# Patient Record
Sex: Female | Born: 1975 | Race: White | Hispanic: No | Marital: Married | Smoking: Never smoker
Health system: Southern US, Community
[De-identification: ages and names within clinical notes are randomized; demographics above are authoritative.]

## PROBLEM LIST (undated history)

## (undated) ENCOUNTER — Inpatient Hospital Stay (HOSPITAL_COMMUNITY): Payer: Self-pay

## (undated) DIAGNOSIS — C439 Malignant melanoma of skin, unspecified: Secondary | ICD-10-CM

## (undated) HISTORY — DX: Malignant melanoma of skin, unspecified: C43.9

---
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
## 1999-09-14 ENCOUNTER — Inpatient Hospital Stay (HOSPITAL_COMMUNITY): Admission: AD | Admit: 1999-09-14 | Discharge: 1999-09-14 | Payer: Self-pay | Admitting: Obstetrics & Gynecology

## 2004-12-17 ENCOUNTER — Other Ambulatory Visit: Admission: RE | Admit: 2004-12-17 | Discharge: 2004-12-17 | Payer: Self-pay | Admitting: Gynecology

## 2005-12-23 ENCOUNTER — Other Ambulatory Visit: Admission: RE | Admit: 2005-12-23 | Discharge: 2005-12-23 | Payer: Self-pay | Admitting: Gynecology

## 2006-09-20 DIAGNOSIS — C439 Malignant melanoma of skin, unspecified: Secondary | ICD-10-CM

## 2006-09-20 HISTORY — DX: Malignant melanoma of skin, unspecified: C43.9

## 2006-09-20 HISTORY — PX: OTHER SURGICAL HISTORY: SHX169

## 2006-12-29 ENCOUNTER — Other Ambulatory Visit: Admission: RE | Admit: 2006-12-29 | Discharge: 2006-12-29 | Payer: Self-pay | Admitting: Gynecology

## 2007-09-21 HISTORY — PX: GALLBLADDER SURGERY: SHX652

## 2007-10-26 ENCOUNTER — Encounter (INDEPENDENT_AMBULATORY_CARE_PROVIDER_SITE_OTHER): Payer: Self-pay | Admitting: Surgery

## 2007-10-26 ENCOUNTER — Ambulatory Visit (HOSPITAL_COMMUNITY): Admission: RE | Admit: 2007-10-26 | Discharge: 2007-10-26 | Payer: Self-pay | Admitting: Surgery

## 2008-01-12 ENCOUNTER — Other Ambulatory Visit: Admission: RE | Admit: 2008-01-12 | Discharge: 2008-01-12 | Payer: Self-pay | Admitting: Gynecology

## 2008-06-06 ENCOUNTER — Encounter: Admission: RE | Admit: 2008-06-06 | Discharge: 2008-06-06 | Payer: Self-pay | Admitting: Chiropractic Medicine

## 2008-12-28 IMAGING — US US ABDOMEN COMPLETE
1 series · 14 of 25 positions shown · non-contrast
Comparison: NONE

CLINICAL DATA: Abdominal pain. 

ABDOMINAL ULTRASOUND

[Series 1: us abd · 0.33mm/px · 14 of 68 slices shown]
[im 1/68]
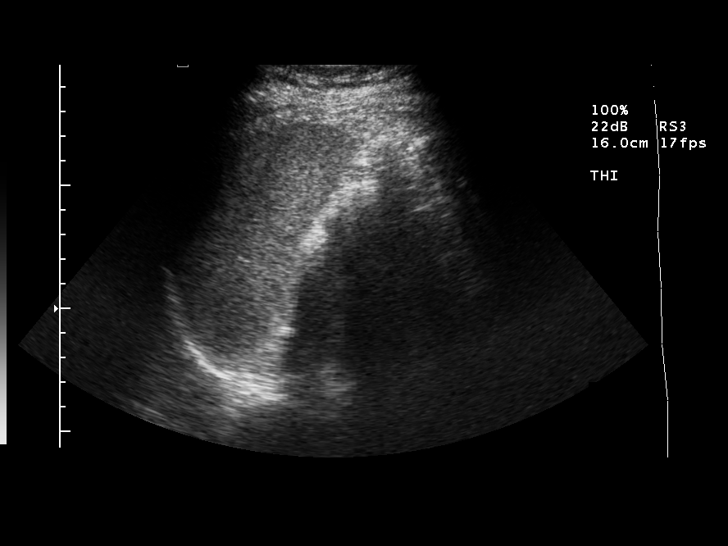
[im 6/68]
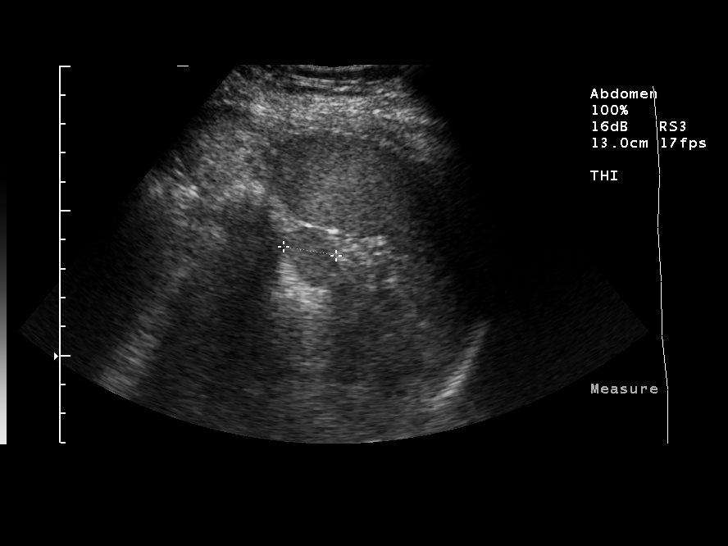
[im 12/68]
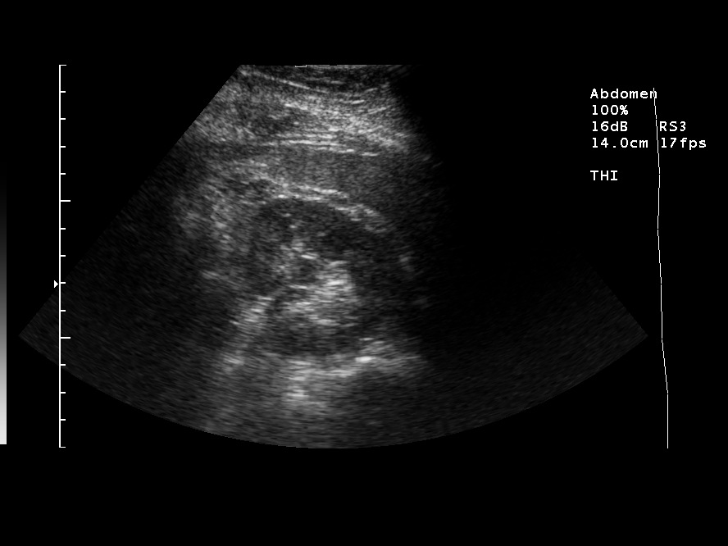
[im 17/68]
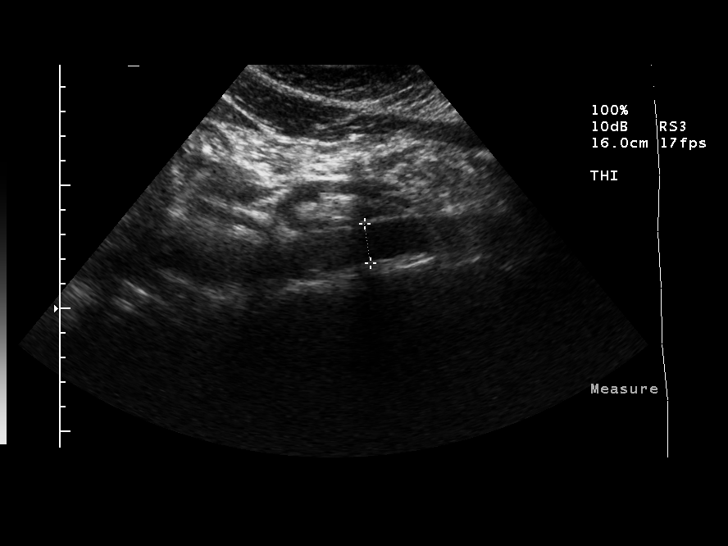
[im 23/68]
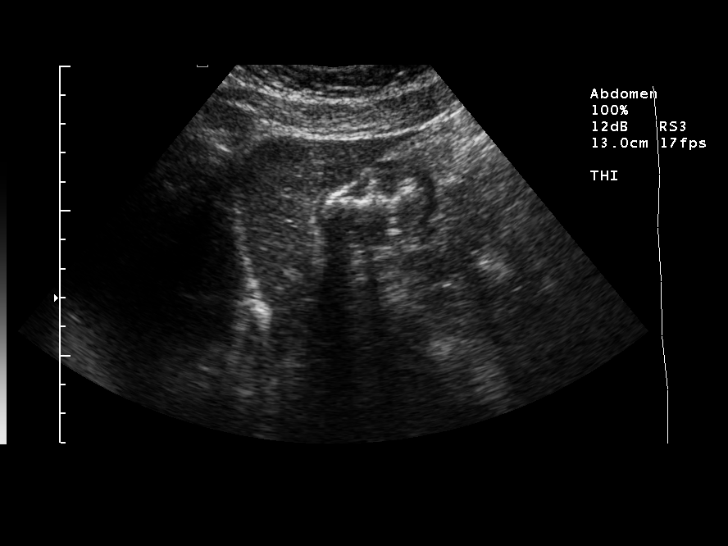
[im 26/68]
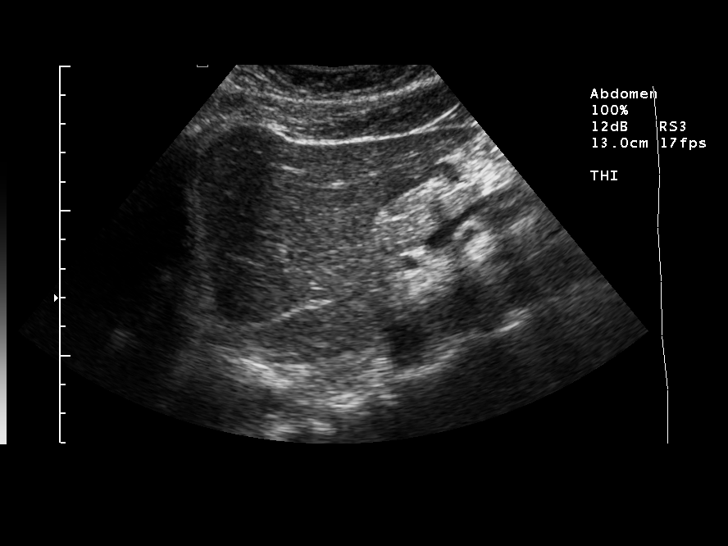
[im 31/68]
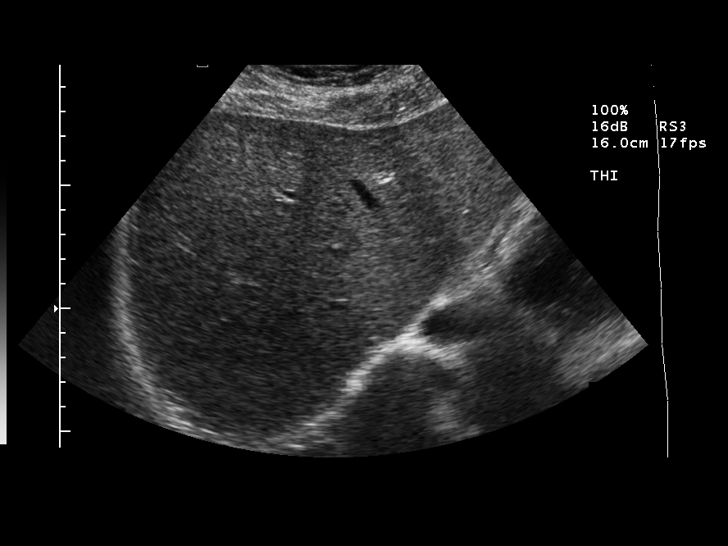
[im 37/68]
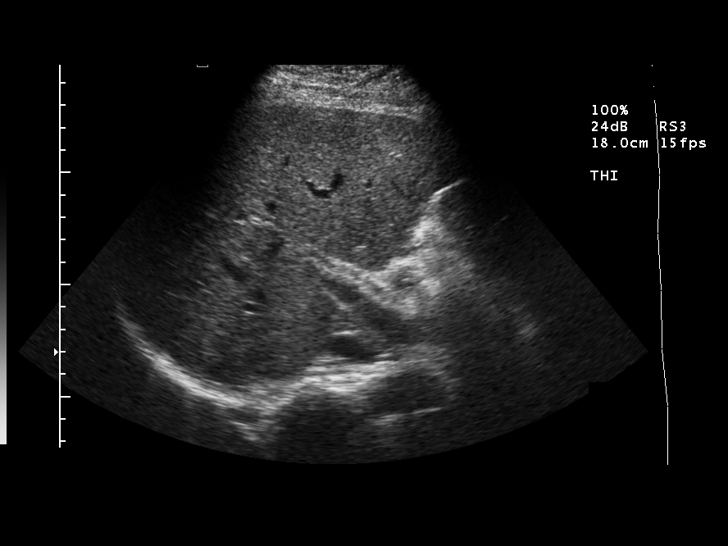
[im 42/68]
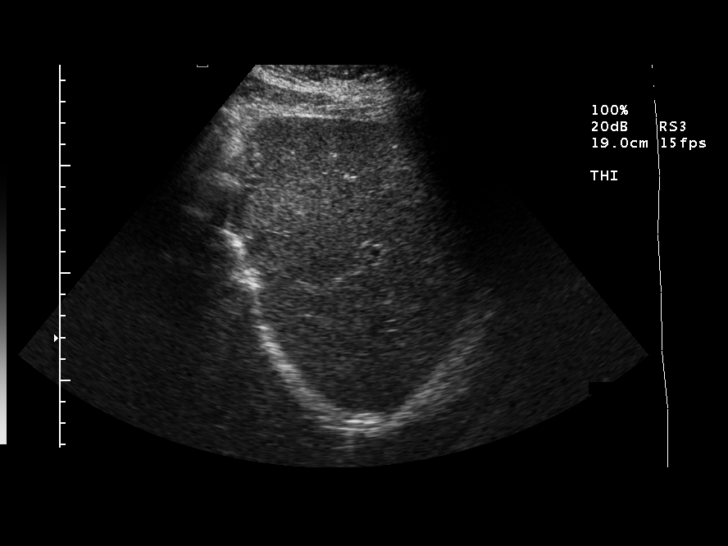
[im 45/68]
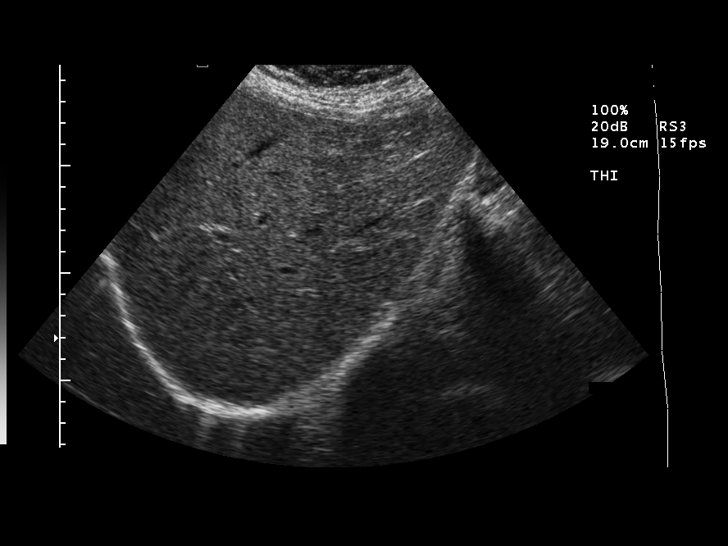
[im 51/68]
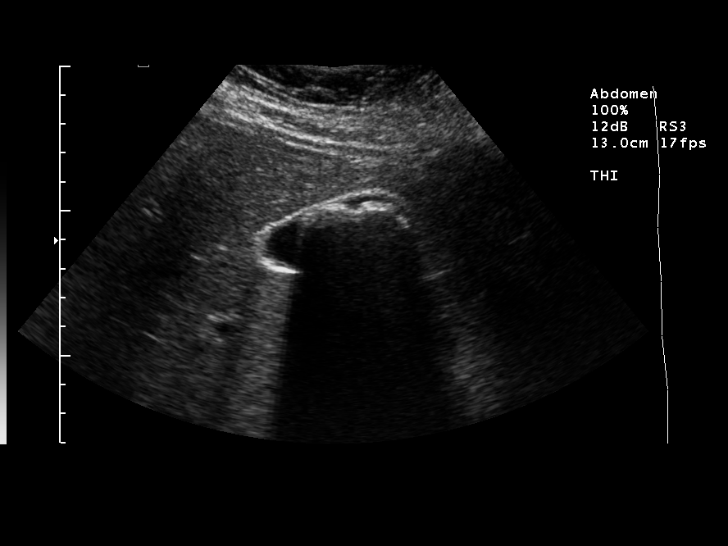
[im 56/68]
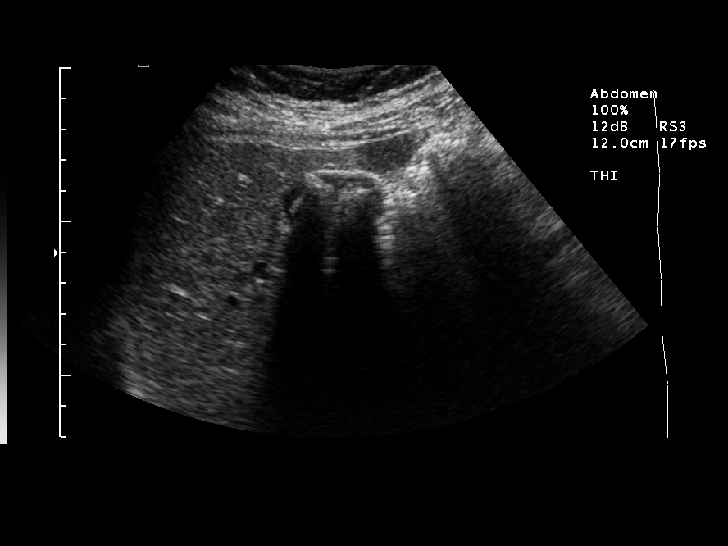
[im 62/68]
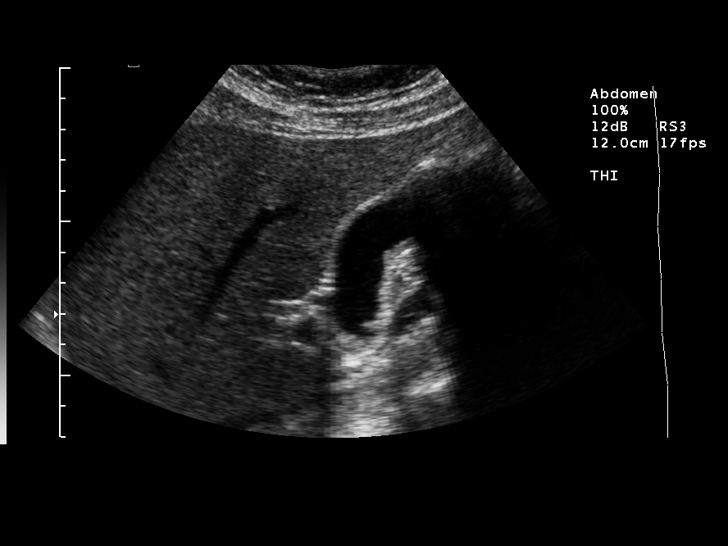
[im 68/68]
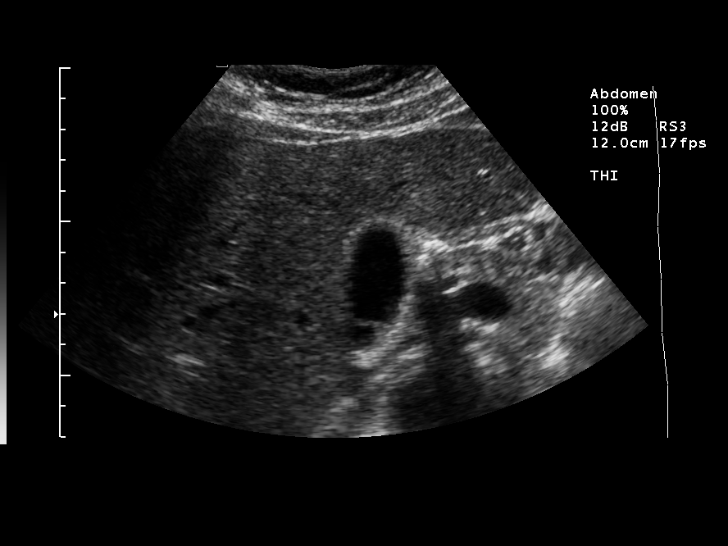

[14 of 25 positions shown; findings below may reference images not displayed]

FINDINGS: The liver is normal appearing. Evaluation of the 
gallbladder reveals multiple intraluminal echogenic foci with 
acoustical shadowing. The common bile duct is maximal at 3.5 mm. 
Aorta, pancreas, and kidneys are unremarkable. There is an 
accessory spleen noted measuring 2.1 x 1.6 x 1.8 cm. No evidence 
of splenomegaly. 

electronically reviewed on 08/04/2007 Dict Date: 08/04/2007  Tran 
Date:  08/04/2007 DAS  [REDACTED]

## 2009-01-13 ENCOUNTER — Other Ambulatory Visit: Admission: RE | Admit: 2009-01-13 | Discharge: 2009-01-13 | Payer: Self-pay | Admitting: Gynecology

## 2009-01-13 ENCOUNTER — Ambulatory Visit: Payer: Self-pay | Admitting: Women's Health

## 2009-01-13 ENCOUNTER — Encounter: Payer: Self-pay | Admitting: Women's Health

## 2009-01-25 ENCOUNTER — Emergency Department (HOSPITAL_COMMUNITY): Admission: EM | Admit: 2009-01-25 | Discharge: 2009-01-25 | Payer: Self-pay | Admitting: Emergency Medicine

## 2010-01-14 ENCOUNTER — Other Ambulatory Visit: Admission: RE | Admit: 2010-01-14 | Discharge: 2010-01-14 | Payer: Self-pay | Admitting: Gynecology

## 2010-01-14 ENCOUNTER — Ambulatory Visit: Payer: Self-pay | Admitting: Women's Health

## 2010-04-01 ENCOUNTER — Ambulatory Visit: Payer: Self-pay | Admitting: Women's Health

## 2011-02-01 ENCOUNTER — Other Ambulatory Visit (HOSPITAL_COMMUNITY)
Admission: RE | Admit: 2011-02-01 | Discharge: 2011-02-01 | Disposition: A | Discharge status: Home / Self Care | Payer: BC Managed Care – PPO | Source: Ambulatory Visit | Attending: Gynecology | Admitting: Gynecology

## 2011-02-01 ENCOUNTER — Other Ambulatory Visit: Payer: Self-pay | Admitting: Women's Health

## 2011-02-01 ENCOUNTER — Encounter (INDEPENDENT_AMBULATORY_CARE_PROVIDER_SITE_OTHER): Payer: BC Managed Care – PPO | Admitting: Women's Health

## 2011-02-01 DIAGNOSIS — R350 Frequency of micturition: Secondary | ICD-10-CM

## 2011-02-01 DIAGNOSIS — Z1159 Encounter for screening for other viral diseases: Secondary | ICD-10-CM | POA: Insufficient documentation

## 2011-02-01 DIAGNOSIS — Z124 Encounter for screening for malignant neoplasm of cervix: Secondary | ICD-10-CM | POA: Insufficient documentation

## 2011-02-01 DIAGNOSIS — Z01419 Encounter for gynecological examination (general) (routine) without abnormal findings: Secondary | ICD-10-CM

## 2011-02-01 DIAGNOSIS — R82998 Other abnormal findings in urine: Secondary | ICD-10-CM

## 2011-02-02 ENCOUNTER — Other Ambulatory Visit (INDEPENDENT_AMBULATORY_CARE_PROVIDER_SITE_OTHER): Payer: BC Managed Care – PPO

## 2011-02-02 DIAGNOSIS — R823 Hemoglobinuria: Secondary | ICD-10-CM

## 2011-02-02 NOTE — Op Note (Signed)
[name], Heather Hicks                 ACCOUNT NO.:  [id number]   MEDICAL RECORD NO.:  [id number]          PATIENT TYPE:  AMB   LOCATION:  SDS                          FACILITY:  [facility]   PHYSICIAN:  Thomas A. Cornett, M.D.DATE OF BIRTH:  1975/10/31   DATE OF PROCEDURE:  10/26/2007  DATE OF DISCHARGE:                               OPERATIVE REPORT   PREOPERATIVE DIAGNOSIS:  Symptomatic cholelithiasis.   POSTOPERATIVE DIAGNOSIS:  Symptomatic cholelithiasis.   PROCEDURE:  Laparoscopic cholecystectomy with intraoperative  cholangiogram.   SURGEON:  Thomas A. Cornett, M.D.   ASSISTANT:  Leonie Man, M.D.   ANESTHESIA:  General endotracheal anesthesia with 0.25% Sensorcaine  local.   ESTIMATED BLOOD LOSS:  20 mL.   SPECIMEN:  Gallbladder with gallstones to pathology.   DRAINS:  None.   INDICATIONS FOR PROCEDURE:  The patient is a 35 year old female with  symptomatic cholelithiasis.  She presents for elective laparoscopic  cholecystectomy for symptomatic cholelithiasis.  The risks were  discussed as well as alternative therapies in the office.  Please see  history and physical for details of that.  She agreed to proceed.   DESCRIPTION OF PROCEDURE:  The patient was brought to the operating room  and placed supine.  After induction of general anesthesia, the abdomen  was prepped and draped in a sterile fashion.  She received preoperative  antibiotics.  A 1 cm infraumbilical incision was made.  Dissection was  carried down to her fascia.  The fascia was identified, opened with a  scalpel, and grasped with Kochers.  I used a hemostat to open the  peritoneal lining into the abdominal cavity without difficulty.  A  pursestring suture of 0 Vicryl was placed and a 12 mm Hassan cannula was  placed under direct vision.  Pneumoperitoneum was created with 15 mmHg  CO2 and the laparoscope was placed.  She was placed in reversed  Trendelenburg and rolled to her left.  Inspection of the  abdominal  cavity revealed no significant abnormality.  The gallbladder was  identified and had signs of chronic cholecystitis.  An 11 mm subxiphoid  port was placed under direct vision.  Two 5 mm ports were placed in the  right upper quadrant.  The gallbladder was grabbed by its dome and  pushed toward the patient's right shoulder.  A second grasper was used  and the infundibulum was grabbed and pulled toward the patient's right  lower quadrant.  This opened the triangle of Calot.  I was able to  dissect out the cystic duct and this was the only tubular structuring  entering the gallbladder from the biliary tree.  Critical angle was  achieved.  After exposing the cystic duct, I placed a clip on the  gallbladder side, made a small incision in it and got bile back.  Through a separate stab wound, a Cook cholangiogram catheter was placed  in the cystic duct and held in place by a clip.  Intraoperative  cholangiogram was then obtained using fluoroscopy and 1/2 strength  Hypaque dye.  There was brisk filling of a short cystic duct into  a  common bile duct with free flow into the duodenum.  There was flow up  the common hepatic duct into right and left hepatic ducts.  There were  some lucencies on the initial cholangiogram run that appeared to be air  bubbles.  On the second run, these indeed behaved like air bubbles on  the film.  These did not appear to be stones since they changed shapes  and when they contacted each other became one as opposed to stones that  would behave as two separate entities on the film.  At this point, I  concluded the cholangiogram and triple clipped the cystic duct after  removing the catheter and divided it.  The cystic artery was identified.  Three clips were placed on this with one on the gallbladder side, as  well, and this was divided.  The cautery was used to dissect the  gallbladder from the gallbladder bed.  There is a posterior branch of  the cystic artery  and a clip placed on this.  Once the gallbladder was  excised from the gallbladder bed, it was placed in an EndoCatch bag.  I  irrigated out the gallbladder bed and used cautery for hemostasis.  She  had some oozing.  The cystic artery stump as well as cystic duct stump  were examined and clips were on these without leakage of bleeding or  bile.  I placed a small piece of Surgicel here as well as the liver bed  with excellent hemostasis.  The excess irrigation was suctioned out.  I  then extracted the gallbladder through the umbilical port with  visualization through the subxiphoid port.  I then closed the fascia  with #1 Vicryl after extracting the gallbladder and passing it off the  field.  The patient was flattened and the excess irrigation was  suctioned out of the right upper quadrant and her 5 mm ports were  removed as well as the 11 mm port.  At this point, after removal of  these ports, we closed the skin after allowing the CO2 to escape with 4-  0 Monocryl and Dermabond.  All final counts of sponge, needle, and  instruments were found to be correct at this portion of the case.  The  patient was awakened and taken to recovery in satisfactory condition.      Thomas A. Cornett, M.D.  Electronically Signed     TAC/MEDQ  D:  10/26/2007  T:  10/27/2007  Job:  [id number]   cc:   Sigmund Hazel, M.D.

## 2011-02-12 ENCOUNTER — Other Ambulatory Visit: Payer: BC Managed Care – PPO

## 2011-06-11 LAB — CBC
Hemoglobin: 12.1
MCHC: 33.8
Platelets: 314
RDW: 13.2

## 2011-06-11 LAB — DIFFERENTIAL
Eosinophils Absolute: 0.1
Lymphs Abs: 3.1
Monocytes Absolute: 0.5
Monocytes Relative: 6
Neutro Abs: 3.8
Neutrophils Relative %: 51

## 2011-06-11 LAB — COMPREHENSIVE METABOLIC PANEL
ALT: 13
Albumin: 3.2 — ABNORMAL LOW
Alkaline Phosphatase: 53
Calcium: 9.2
Glucose, Bld: 119 — ABNORMAL HIGH
Potassium: 4
Sodium: 139
Total Protein: 6.3

## 2011-08-16 ENCOUNTER — Other Ambulatory Visit: Payer: Self-pay | Admitting: Obstetrics and Gynecology

## 2011-08-21 ENCOUNTER — Inpatient Hospital Stay (HOSPITAL_COMMUNITY)
Admission: AD | Admit: 2011-08-21 | Discharge: 2011-08-21 | Disposition: A | Discharge status: Home / Self Care | Payer: BC Managed Care – PPO | Source: Ambulatory Visit | Attending: Obstetrics and Gynecology | Admitting: Obstetrics and Gynecology

## 2011-08-21 ENCOUNTER — Other Ambulatory Visit: Payer: Self-pay | Admitting: Obstetrics and Gynecology

## 2011-08-21 DIAGNOSIS — Z6791 Unspecified blood type, Rh negative: Secondary | ICD-10-CM

## 2011-08-21 DIAGNOSIS — O99891 Other specified diseases and conditions complicating pregnancy: Secondary | ICD-10-CM | POA: Insufficient documentation

## 2011-08-21 DIAGNOSIS — O26899 Other specified pregnancy related conditions, unspecified trimester: Secondary | ICD-10-CM

## 2011-08-21 NOTE — Plan of Care (Signed)
Patient was for outpatient labs only. Was not to be seen in MAU unless blood type was negative. Dr. Tenny Craw had talked with the patient.

## 2012-02-11 ENCOUNTER — Other Ambulatory Visit: Payer: Self-pay

## 2012-03-02 ENCOUNTER — Encounter (HOSPITAL_COMMUNITY): Payer: Self-pay | Admitting: *Deleted

## 2012-03-02 ENCOUNTER — Inpatient Hospital Stay (HOSPITAL_COMMUNITY): Payer: BC Managed Care – PPO

## 2012-03-02 ENCOUNTER — Inpatient Hospital Stay (HOSPITAL_COMMUNITY)
Admission: AD | Admit: 2012-03-02 | Discharge: 2012-03-03 | Disposition: A | Discharge status: Home / Self Care | Payer: BC Managed Care – PPO | Source: Ambulatory Visit | Attending: Obstetrics and Gynecology | Admitting: Obstetrics and Gynecology

## 2012-03-02 DIAGNOSIS — D259 Leiomyoma of uterus, unspecified: Secondary | ICD-10-CM

## 2012-03-02 DIAGNOSIS — N83209 Unspecified ovarian cyst, unspecified side: Secondary | ICD-10-CM | POA: Insufficient documentation

## 2012-03-02 DIAGNOSIS — O34599 Maternal care for other abnormalities of gravid uterus, unspecified trimester: Secondary | ICD-10-CM | POA: Insufficient documentation

## 2012-03-02 DIAGNOSIS — R1032 Left lower quadrant pain: Secondary | ICD-10-CM | POA: Insufficient documentation

## 2012-03-02 LAB — CBC
MCHC: 32.3 g/dL (ref 30.0–36.0)
Platelets: 239 10*3/uL (ref 150–400)
RDW: 12.4 % (ref 11.5–15.5)
WBC: 12.7 10*3/uL — ABNORMAL HIGH (ref 4.0–10.5)

## 2012-03-02 LAB — URINALYSIS, ROUTINE W REFLEX MICROSCOPIC
Glucose, UA: NEGATIVE mg/dL
Leukocytes, UA: NEGATIVE
pH: 6.5 (ref 5.0–8.0)

## 2012-03-02 LAB — URINE MICROSCOPIC-ADD ON

## 2012-03-02 NOTE — MAU Provider Note (Signed)
Heather J Samet36 y.o.G2P0010 @[redacted]w[redacted]d  by LMP No chief complaint on file.    First Provider Initiated Contact with Patient 03/02/12 [id number]      SUBJECTIVE  HPI: Pt presents with report of severe LLQ pain, nausea x24 hours with vomiting x1 today, some chills, and some menstrual-like cramping.  She has had normal ultrasounds at her prenatal visits per the pt.  She denies LOF, vaginal bleeding, vaginal itching/burning, urinary symptoms, h/a, or dizziness.     Past Medical History  Diagnosis Date  . Rubella 2006    rubella positive  . Migraine   . 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 Tonsillectomy 1989  . Gallbladder surgery 2009  . Melanoma 2008    removed from neck   History   Social History  . Marital Status: Married    Spouse Name: N/A    Number of Children: N/A  . Years of Education: N/A   Occupational History  . Not on file.   Social History Main Topics  . Smoking status: Never Smoker   . Smokeless tobacco: Not on file  . Alcohol Use: No  . Drug Use: No  . Sexually Active:    Other Topics Concern  . Not on file   Social History Narrative  . No narrative on file   No current facility-administered medications on file prior to encounter.   No current outpatient prescriptions on file prior to encounter.   Allergies  Allergen Reactions  . Penicillins Rash    ROS: Pertinent items in HPI  OBJECTIVE Blood pressure 107/59, pulse 98, temperature 98 F (36.7 C), temperature source Oral, resp. rate 20, height 5\' 6"  (1.676 m), weight 103.59 kg (228 lb 6 oz).  GENERAL: Well-developed, well-nourished female in no acute distress.  HEENT: Normocephalic, good dentition HEART: normal rate RESP: normal effort ABDOMEN: Soft, Pt has mild tenderness in lower abdomen, greater on left Negative guarding, negative rebound tenderness EXTREMITIES: Nontender, no edema NEURO: Alert and oriented  Neg CVA tenderness  Cervix 0/long/high, firm,  posterior  FHT 165 by doppler  LAB RESULTS Results for orders placed during the hospital encounter of 03/02/12 (from the past 24 hour(s))  URINALYSIS, ROUTINE W REFLEX MICROSCOPIC     Status: Abnormal   Collection Time   03/02/12  7:31 PM      Component Value Range   Color, Urine YELLOW  YELLOW   APPearance CLEAR  CLEAR   Specific Gravity, Urine <1.005 (*) 1.005 - 1.030   pH 6.5  5.0 - 8.0   Glucose, UA NEGATIVE  NEGATIVE mg/dL   Hgb urine dipstick SMALL (*) NEGATIVE   Bilirubin Urine NEGATIVE  NEGATIVE   Ketones, ur NEGATIVE  NEGATIVE mg/dL   Protein, ur NEGATIVE  NEGATIVE mg/dL   Urobilinogen, UA 0.2  0.0 - 1.0 mg/dL   Nitrite NEGATIVE  NEGATIVE   Leukocytes, UA NEGATIVE  NEGATIVE  URINE MICROSCOPIC-ADD ON     Status: Normal   Collection Time   03/02/12  7:31 PM      Component Value Range   Squamous Epithelial / LPF RARE  RARE   WBC, UA 0-2  <3 WBC/hpf   RBC / HPF 0-2  <3 RBC/hpf   Bacteria, UA RARE  RARE  CBC     Status: Abnormal   Collection Time   03/02/12 10:45 PM      Component Value Range   WBC 12.7 (*) 4.0 - 10.5 K/uL  RBC 4.03  3.87 - 5.11 MIL/uL   Hemoglobin 10.9 (*) 12.0 - 15.0 g/dL   HCT 16.1 (*) 09.6 - 04.5 %   MCV 83.6  78.0 - 100.0 fL   MCH 27.0  26.0 - 34.0 pg   MCHC 32.3  30.0 - 36.0 g/dL   RDW 40.9  81.1 - 91.4 %   Platelets 239  150 - 400 K/uL     IMAGING US Renal  03/03/2012  *RADIOLOGY REPORT*  Clinical Data: Left lower quadrant pain, pregnant.  RENAL/URINARY TRACT ULTRASOUND COMPLETE  Comparison:  08/04/2007  Findings:  Right Kidney:  Measures 11.7 cm.  No hydronephrosis.  No focal abnormality.  Left Kidney:  Measures 11.9 cm.  No hydronephrosis.  There is a 1.1 x 1.0 x 0.8 cm hypoechoic/nearly anechoic lesion arising the lower pole, favored to represent a cyst however not definitively characterized on this examination.  Bladder:  Poorly visualized as the patient had with prior to the examination.  IMPRESSION: No hydronephrosis or shadowing calculi  identified.  Hypoechoic/ nearly anechoic focus arising the lower pole of the left kidney measuring 1.1 cm is favored to represent a cyst however incompletely characterized on this examination.  Consider a 60-month ultrasound follow-up.  Original Report Authenticated By: Waneta Martins, M.D.   Verbal report from sonographer of multiple uterine fibroids with largest on left side, where pt indicates greatest amount of pain  ASSESSMENT Fibroid uterus 1.1 cm cyst on left ovary, negative for renal calculi  PLAN Called Dr Henderson Cloud to review assessment and findings Ibuprofen 600 mg x1 dose in MAU D/C home F/U in office if pain persists or return to MAU  LEFTWICH-KIRBY, Sharmain Lastra 03/02/2012 10:52 PM

## 2012-03-02 NOTE — MAU Note (Signed)
PT SAYS  THIS WEEK SHE HAS FELT PRESSURE- BECAME WORSE  ON WED, THEN CALLED DR THIS AM. FEELS LOWER CRAMPS- COMES/ GOES-   HAD U/S IN OFFICE.  FEEELS CRAMPING  NOW.  TALKED  TO DR Hessie Diener ROSS - TOLD HER TO TAKE  MED  THAT SHE USUALLY TAKES FOR H/S- IF WORSE- COME TO [facility]- THEN IF NOT COME TO OFFICE TOMPRROW.  HAS HX- MIGRAINES.

## 2012-03-02 NOTE — MAU Note (Signed)
WANTS TO VOID FIRST.

## 2012-03-03 DIAGNOSIS — D259 Leiomyoma of uterus, unspecified: Secondary | ICD-10-CM

## 2012-03-03 MED ORDER — IBUPROFEN 600 MG PO TABS
600.0000 mg | ORAL_TABLET | ORAL | Status: AC
  Administered 2012-03-03: 600 mg via ORAL
  Filled 2012-03-03: qty 1

## 2012-03-03 NOTE — Discharge Instructions (Signed)
Fibroids You have been diagnosed as having a fibroid. Fibroids are smooth muscle lumps (tumors) which can occur any place in a woman's body. They are usually in the womb (uterus). The most common problem (symptom) of fibroids is bleeding. Over time this may cause low red blood cells (anemia). Other symptoms include feelings of pressure and pain in the pelvis. The diagnosis (learning what is wrong) of fibroids is made by physical exam. Sometimes tests such as an ultrasound are used. This is helpful when fibroids are felt around the ovaries and to look for tumors. TREATMENT   Most fibroids do not need surgical or medical treatment. Sometimes a tissue sample (biopsy) of the lining of the uterus is done to rule out cancer. If there is no cancer and only a small amount of bleeding, the problem can be watched.   Hormonal treatment can improve the problem.   When surgery is needed, it can consist of removing the fibroid. Vaginal birth may not be possible after the removal of fibroids. This depends on where they are and the extent of surgery. When pregnancy occurs with fibroids it is usually normal.   Your caregiver can help decide which treatments are best for you.  HOME CARE INSTRUCTIONS   Do not use aspirin as this may increase bleeding problems.   If your periods (menses) are heavy, record the number of pads or tampons used per month. Bring this information to your caregiver. This can help them determine the best treatment for you.  SEEK IMMEDIATE MEDICAL CARE IF:  You have pelvic pain or cramps not controlled with medications, or experience a sudden increase in pain.   You have an increase of pelvic bleeding between and during menses.   You feel lightheaded or have fainting spells.   You develop worsening belly (abdominal) pain.  Document Released: 09/03/2000 Document Revised: 08/26/2011 Document Reviewed: 04/25/2008 [employer] Patient Information 2012 [employer].    

## 2012-04-20 ENCOUNTER — Other Ambulatory Visit: Payer: Self-pay

## 2012-04-24 ENCOUNTER — Other Ambulatory Visit (HOSPITAL_COMMUNITY): Payer: Self-pay | Admitting: Obstetrics and Gynecology

## 2012-04-24 DIAGNOSIS — O289 Unspecified abnormal findings on antenatal screening of mother: Secondary | ICD-10-CM

## 2012-04-25 ENCOUNTER — Ambulatory Visit (HOSPITAL_COMMUNITY)
Admission: RE | Admit: 2012-04-25 | Discharge: 2012-04-25 | Disposition: A | Discharge status: Home / Self Care | Payer: BC Managed Care – PPO | Source: Ambulatory Visit | Attending: Obstetrics and Gynecology | Admitting: Obstetrics and Gynecology

## 2012-04-25 ENCOUNTER — Encounter (HOSPITAL_COMMUNITY): Payer: Self-pay

## 2012-04-25 DIAGNOSIS — O289 Unspecified abnormal findings on antenatal screening of mother: Secondary | ICD-10-CM

## 2012-04-25 DIAGNOSIS — O3500X0 Maternal care for (suspected) central nervous system malformation or damage in fetus, unspecified, not applicable or unspecified: Secondary | ICD-10-CM | POA: Insufficient documentation

## 2012-04-25 DIAGNOSIS — O358XX0 Maternal care for other (suspected) fetal abnormality and damage, not applicable or unspecified: Secondary | ICD-10-CM | POA: Insufficient documentation

## 2012-04-25 DIAGNOSIS — O350XX0 Maternal care for (suspected) central nervous system malformation in fetus, not applicable or unspecified: Secondary | ICD-10-CM | POA: Insufficient documentation

## 2012-04-25 NOTE — Progress Notes (Signed)
Heather Hicks was seen for ultrasound appointment today.  Please see [facility] report for details.

## 2012-04-27 NOTE — Progress Notes (Signed)
Genetic Counseling  High-Risk Gestation Note  Appointment Date:  04/25/2012 Referred By: Almon Hercules., MD Date of Birth:  07-07-1976    Pregnancy History: G2P0010 Estimated Date of Delivery: 09/17/12 Estimated Gestational Age: [redacted]w[redacted]d Attending: Rema Fendt, MD  I met with Ms. Heather Hicks and her partner, Mr. Heather Hicks for genetic counseling because of a prenatal diagnosis of fetal Down syndrome based on FISH (fluorescent in situ hybridization) results from amniocentesis.   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 Final karyotype analysis from amniocentesis is currently pending. We did review that the [facility] test can not distinguish between trisomy 42, translocation 39, or in some cases, mosaic trisomy 73.    We discussed that there are different types of Down syndrome, and each type is determined by the arrangement of the #21 chromosomes. Approximately 95% of cases of Down syndrome are trisomy 21 and 2-4% are due to a translocation involving chromosome 21. We reviewed chromosomes, nondisjunction, and that chromosome division errors happen by chance and are not usually inherited. They understand that the actual karyotype result from amniocentesis and will help to determine accurate risks of recurrence for a future pregnancy.   This couple was counseled that Down syndrome occurs once per every 800 births and is associated with specific features. However, there is a high degree of variability seen among children who have this condition, meaning that every child with Down syndrome will not be affected in exactly the same way, and some children will have more or less features than others. They were counseled that approximately half of individuals with Down syndrome have a cardiac anomaly and ~10-15% have an intestinal difference. Other anomalies of various organ systems have also been  described in association with this diagnosis.    We discussed that in general most individuals with Down syndrome have mild to moderate mental retardation and likely will require extra assistance with school work. Approximately 70-80% of children with Down syndrome have hypotonia which may lead to delays in sitting, walking, and talking. Hypotonia does tend to improve with age and early intervention services such as physical, occupational, and speech therapies can help with achievement of developmental milestones. We discussed that these therapies are typically provided to children (with qualifying diagnoses) from birth until age 66. We also discussed that Down syndrome is associated with characteristic facial features. Because of these facial features, many children with Down syndrome look similar to each other, but they were reminded that each child with Down syndrome is unique and will have many more features in common with his or her own family members.  We discussed local and national support organizations and offered to connect this couple with another couple who were given a prenatal diagnosis of Down syndrome. We provided the couple with resources regarding information on Down syndrome and resources to assist with decision making in the pregnancy. Additionally, we discussed that postnatal health management can be coordinated by a medical geneticist as well as with a multidisciplinary team of physicians (Down syndrome clinic).   Targeted ultrasound was performed at the time of today's visit. 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 An echogenic focus is generally believed to be a normal variation without any concerns for the pregnancy.  Isolated echogenic cardiac foci are not associated with congenital heart defects in the baby or compromised cardiac function after birth.  However, an echogenic cardiac focus  is associated with an increased chance for  Down syndrome in the pregnancy. Atrioventricular septal (or AV canal) defect is associated with an approximate 50% risk for fetal aneuploidy, and is specifically associated with Down syndrome.   We reviewed the options for the pregnancy including continuing the pregnancy with expectant management (including follow-up ultrasound and fetal echocardiogram), adoption, and termination of pregnancy. The couple stated that they are not considering adoption. They are aware that the legal limit for termination of pregnancy in [place] is [redacted] weeks gestation. We discussed the methods for TOP including induction and D&E. Dr. Otho Perl also met with the couple today to review the risks, benefits, limitations, and timing of TOP procedures.The couple planned to contact our office later with their decision of how to proceed.   The couple later elected to proceed with termination of pregnancy via D&E, which is being facilitated through our practice this week.   The majority of time (>50%) was spent on counseling with this couple. The approximate face-to-face time with the genetic counselor was 45 minutes.   Quinn Plowman, MS Certified Genetic Counselor  04/27/2012

## 2012-05-09 ENCOUNTER — Telehealth (HOSPITAL_COMMUNITY): Payer: Self-pay | Admitting: MS"

## 2012-05-09 NOTE — Telephone Encounter (Signed)
Called Ms. Heather Hicks to follow-up regarding how she and partner are doing. She stated that she is doing OK and has lots of friends and family for support. She and her partner also have recently started seeing a therapist who has worked with others in similar situation (TOP following genetic anomalies). Ms. Heather Hicks stated that she does not feel that there are a lot of resources, or at least ones publicized for individuals who have terminated a pregnancy following a genetic diagnosis/fetal anomalies. Discussed that this is an area that can be difficult to find appropriate support. However, discussed that [employer], a local support group has had individuals who ended a pregnancy following fetal anomalies or diagnosis. Ms. Heather Hicks stated that she would be open to being a resource for individuals in the future.   We also discussed that the final karyotype from the amniocentesis resulted and confirmed the finding of trisomy 21, denoted as 5, XY, +21. We reviewed that trisomy 77 is sporadic and occurs as a result of nondisjunction. Thus, follow-up chromosome analysis for Ms. Heather Hicks and her partner would not be warranted at this time. Once a couple has had one pregnancy with Trisomy 21, the risk of any fetal trisomy (including Trisomy 22) in a future pregnancy is the greatest of the following figures: either 1% or a woman's age-related risk for a chromosome condition.  Since Ms. Heather Hicks is over 33 y.o., we discussed that recurrence risk for a future pregnancy would be her age-related risk. We discussed that the chance for fetal aneuploidy at age 75 y.o. In the first trimester is approximately 1 in 28. This risk estimate will change with time, varying with her age at delivery. We reviewed the screening and diagnostic testing options that would be available in a future pregnancy, including [facility] and amniocentesis.   Ms. Heather Hicks was encouraged to contact us with additional questions or concerns, of if we can  provide additional support or resources in the future.   Clydie Braun Timberlynn Kizziah 05/09/2012 10:07 AM

## 2012-10-06 ENCOUNTER — Other Ambulatory Visit: Payer: Self-pay

## 2012-10-27 ENCOUNTER — Other Ambulatory Visit (HOSPITAL_COMMUNITY): Payer: Self-pay | Admitting: Obstetrics and Gynecology

## 2012-10-27 ENCOUNTER — Ambulatory Visit (HOSPITAL_COMMUNITY)
Admission: RE | Admit: 2012-10-27 | Discharge: 2012-10-27 | Disposition: A | Discharge status: Home / Self Care | Payer: BC Managed Care – PPO | Source: Ambulatory Visit | Attending: Obstetrics and Gynecology | Admitting: Obstetrics and Gynecology

## 2012-10-27 DIAGNOSIS — O09529 Supervision of elderly multigravida, unspecified trimester: Secondary | ICD-10-CM | POA: Insufficient documentation

## 2012-10-27 DIAGNOSIS — O09299 Supervision of pregnancy with other poor reproductive or obstetric history, unspecified trimester: Secondary | ICD-10-CM | POA: Insufficient documentation

## 2012-10-27 DIAGNOSIS — Q909 Down syndrome, unspecified: Secondary | ICD-10-CM

## 2012-10-27 NOTE — Progress Notes (Signed)
Genetic Counseling  High-Risk Gestation Note  Appointment Date:  10/27/2012 Referred By: Heather Hicks., MD Date of Birth:  Sep 02, 1976  Estimated Date of Delivery: 05/30/2013 Gestational age: [redacted]w[redacted]d  Pregnancy history: G3P0020  Attending: Alpha Gula, MD  Heather Hicks and her husband, Heather Hicks, were seen for genetic counseling because of a maternal age of 37 y.o. and previous pregnancy with trisomy 38. The couple was seen in a previous pregnancy for genetic counseling. Please see previous note from 04/25/12 for detailed discussion from that visit.   Both family histories were updated. The couple's previous pregnancy was diagnosed with trisomy 22, denoted as 53,XY,+21 via amniocentesis.  The family histories were otherwise noncontributory for updated regarding birth defects, mental retardation, and known genetic conditions since the couple's previous genetic counseling visit. Please see genetic counseling note from 04/25/2012 for detailed family history discussion.   We reviewed that trisomy 45 is sporadic and occurs as a result of nondisjunction. Given that trisomy 29 occurred in a pregnancy with maternal age > 69 years old and given the patient's current age in pregnancy, recurrence risk for the trisomy 34 in the current pregnancy is estimated to be 1 in 7 Heather Hicks, 2004). They were counseled regarding maternal age and the association with risk for chromosome conditions due to nondisjunction with aging of the ova.   We reviewed chromosomes, nondisjunction, and the associated risk for fetal aneuploidy related to a maternal age of 37 y.o. and previous history of trisomy 21 pregnancy in the first trimester.  They were counseled that the risk for aneuploidy decreases as gestational age increases, accounting for those pregnancies which spontaneously abort.  We specifically reviewed Down syndrome (trisomy 26), trisomies 66 and 47, and sex chromosome aneuploidies (47,XXX and 47,XXY)  including the common features and prognoses of each.   The couple is aware of available screening options for fetal aneuploidy including First screen, Quad screen, noninvasive prenatal testing (NIPT), and detailed ultrasound. They understand that screening tests are used to modify a patient's a priori risk for aneuploidy, typically based on age.  This estimate provides a pregnancy specific risk assessment and is not diagnostic for these conditions.  In addition, we discussed that ~50-80% of fetuses with Down syndrome and up to 90-95% of fetuses with trisomy 18/13, when well visualized, have detectable anomalies or soft markers by detailed ultrasound (~18+ weeks gestation). Mrs. Heather Hicks requested for targeted ultrasound to be performed in the second trimester at [facility]. We are able to facilitate this ultrasound, if desired. No appointment was made at this time.   They were also counseled regarding diagnostic testing via chorionic villus sampling (CVS) at 10-[redacted] weeks gestation or amniocentesis after [redacted] weeks gestation.  We reviewed the approximate 1 in 100 risk for complications for CVS and the approximate 1 in 300-500 risk for complications for amniocentesis, including spontaneous pregnancy loss. We discussed the risks, limitations, and benefits of each screening and testing option including the risk for confined placental mosaicism and maternal cell contamination. The couple understands that these screens and tests cannot identify or rule out all birth defects or genetic conditions.   After consideration of all the options, they elected to proceed with chorionic villus sampling (CVS) in the pregnancy for fluorescent in situ hybridization (FISH) studies and karyotype analysis. CVS procedure is scheduled for 11/01/12 at the [facility] with Dr. Rema Hicks.  Given that CVS cannot assess for open neural tube defects, this screening  is available in the second  trimester following CVS procedure.   Heather Hicks was provided with written information regarding cystic fibrosis (CF) including the carrier frequency and incidence in the Caucasian population, the availability of carrier testing and prenatal diagnosis if indicated.  In addition, we discussed that CF is routinely screened for as part of the [place] newborn screening panel.  CF carrier screening was previously performed through her OB office during the previous pregnancy and was negative for the 32 mutations analyzed. Thus, her risk to be a carrier has been reduced to approximately 1 in 400, which significantly reduces the risk for CF in the current pregnancy.    We reviewed the availability of carrier screening for certain genetic conditions based on Jewish ancestry.  Genetic testing is available for some of the more common conditions, including Canavan disease, cystic fibrosis, Tay sachs disease, Gaucher disease, Mucolipidosis type IV,  Neimann-Pick type A, Fanconi anemia type C, Bloom syndrome, and familial dysautonomia.  All of these conditions are inherited in an autosomal recessive fashion, where both parents have to be carriers of the condition before a baby is at increased risk (1 in 4, or 25% risk) to inherit the disease. Carrier screening performed through a standard blood draw can reduce but not eliminate a person's chance to be a carrier for these conditions. Heather Hicks stated that she previously declined Ashkenazi Jewish ancestry based carrier screening, except for cystic fibrosis carrier screening, given that other relatives have previously had negative carrier screening and given that the father of the pregnancy has no known Jewish ancestry.   Heather Hicks denied exposure to environmental toxins or chemical agents. She denied the use of alcohol, tobacco or street drugs. She denied significant viral illnesses during the course of her pregnancy. Her medical and surgical histories were  otherwise noncontributory.   I counseled this couple regarding the above risks and available options.  The approximate face-to-face time with the genetic counselor was 25 minutes.  Quinn Plowman, MS,  Certified Genetic Counselor  10/27/2012

## 2012-11-01 ENCOUNTER — Ambulatory Visit (HOSPITAL_COMMUNITY)
Admission: RE | Admit: 2012-11-01 | Discharge: 2012-11-01 | Disposition: A | Discharge status: Home / Self Care | Payer: BC Managed Care – PPO | Source: Ambulatory Visit | Attending: Obstetrics and Gynecology | Admitting: Obstetrics and Gynecology

## 2012-11-01 ENCOUNTER — Other Ambulatory Visit (HOSPITAL_COMMUNITY): Payer: Self-pay | Admitting: Obstetrics and Gynecology

## 2012-11-01 ENCOUNTER — Encounter (HOSPITAL_COMMUNITY): Payer: Self-pay

## 2012-11-01 DIAGNOSIS — Q909 Down syndrome, unspecified: Secondary | ICD-10-CM

## 2012-11-01 DIAGNOSIS — O09529 Supervision of elderly multigravida, unspecified trimester: Secondary | ICD-10-CM

## 2012-11-01 DIAGNOSIS — O352XX0 Maternal care for (suspected) hereditary disease in fetus, not applicable or unspecified: Secondary | ICD-10-CM | POA: Insufficient documentation

## 2012-11-01 NOTE — Progress Notes (Signed)
Heather Hicks was seen for ultrasound appointment today.  Please see [facility] report for details.

## 2012-11-13 ENCOUNTER — Other Ambulatory Visit: Payer: Self-pay

## 2012-11-20 ENCOUNTER — Telehealth (HOSPITAL_COMMUNITY): Payer: Self-pay | Admitting: MS"

## 2012-11-20 NOTE — Telephone Encounter (Signed)
Patient called to request that her husband, Heather Hicks, be called with results of noninvasive prenatal testing (NIPT)/Harmony, once available. His number is [phone]. Patient stated that given the situation and her work environment, the couple had discussed that it would be best for him to be contacted with the information. Discussed with Heather Hicks that results are currently pending. Given the average turnaround time, would expected later this week. She understands that she is welcome to call back with questions or concerns regarding the results at any time.   Heather Hicks stated that she is not sure if she will be able to fully be comfortable with the results of Harmony given what happened in their previous pregnancy, but would like to get the results and hear the assessment of the medical professionals regarding the results in order to help them decide how to proceed. Reviewed that targeted ultrasound is available at approximately 17-[redacted] weeks gestation and that amniocentesis is also available to the couple after [redacted] weeks gestation, if desired. We will plan to discuss what additional screening/testing the couple is interested in pursuing following results of Harmony.   Heather Hicks 11/20/2012 10:06 AM

## 2012-11-23 ENCOUNTER — Telehealth (HOSPITAL_COMMUNITY): Payer: Self-pay | Admitting: MS"

## 2012-11-23 NOTE — Telephone Encounter (Signed)
Mr. Heather Hicks and Mrs. Heather Hicks returned call. Called Heather Hicks to discuss her Harmony, cell free fetal DNA testing. Patient was identified by name and DOB. Testing was offered because of advanced maternal age and previous pregnancy with trisomy 60. We reviewed that these are within normal limits, showing a less than 1 in 10,000 risk for trisomies 21, 18 and 13. We reviewed that this testing identifies > 99% of pregnancies with trisomy 21, >98% of pregnancies with trisomy 62, and >80% with trisomy 62; the false positive rate is <0.1% for all conditions.   The couple inquired about the gender of the pregnancy. We discussed that testing was not performed for X and Y chromosome analysis. I reviewed that this lab has three options for ordering the test and that 21, 18, and 13 analysis is the base panel. Thus, the order form must have been checked only for these conditions.They expressed understanding of this. She understands that this testing does not identify all genetic conditions.   [name] is interested in an early detailed anatomy scan and would like Dr. Baltazar Apo thoughts on an appropriate time to perform this exam. I plan to discuss this with Dr. Otho Perl tomorrow when he is in the [place] office and call patient back tomorrow. She understands that the option of amniocentesis is also available in pregnancy. All questions were answered to her satisfaction, she was encouraged to call with additional questions or concerns.  Quinn Plowman, MS Certified Genetic Counselor 11/23/2012 3:30 PM     Left message for patient to return call.   [name] 11/23/2012 1:37 PM

## 2012-11-24 ENCOUNTER — Telehealth (HOSPITAL_COMMUNITY): Payer: Self-pay | Admitting: MS"

## 2012-11-24 NOTE — Telephone Encounter (Signed)
Called Mrs. Heather Hicks regarding her question about timing of detailed anatomy scan. Per discussion with Dr. Otho Perl, can do ultrasound at [redacted] weeks gestation. Patient understands that at that early gestational age, views of fetal anatomy will likely be limited. However, this would also give patient the option of amniocentesis at that same appointment, if desired. Patient planning to come for appointment on 12/15/12 at 8:30 am.   Quinn Plowman 11/24/2012 4:06 PM

## 2012-12-11 ENCOUNTER — Other Ambulatory Visit: Payer: Self-pay

## 2012-12-13 ENCOUNTER — Other Ambulatory Visit (HOSPITAL_COMMUNITY): Payer: Self-pay | Admitting: Obstetrics and Gynecology

## 2012-12-13 DIAGNOSIS — O09529 Supervision of elderly multigravida, unspecified trimester: Secondary | ICD-10-CM

## 2012-12-14 ENCOUNTER — Telehealth (HOSPITAL_COMMUNITY): Payer: Self-pay | Admitting: MS"

## 2012-12-14 NOTE — Telephone Encounter (Signed)
Ms. ANGELL PINCOCK called to update [place] that she elected to have amniocentesis through her primary OB office and results came back normal. Patient feels much better having this information. She stated that Dr. Tenny Craw is planning to also fax results to our office. Reviewed her early anatomy ultrasound that is scheduled tomorrow was to attempt early anatomy scan and was also, at that time, scheduled to give patient option of amniocentesis in early second trimester. Patient stated that she would prefer to move targeted ultrasound to later in the pregnancy. Reviewed that targeted ultrasound at 18-19 weeks is better time frame to get views of fetal anatomy. Ms. Morreale requested to come for ultrasound on a date when Dr. Otho Perl is scheduled to be here. She planned to move her ultrasound from tomorrow to 01/04/13 at 8:30 am.   Quinn Plowman 12/14/2012 2:44 PM

## 2012-12-15 ENCOUNTER — Ambulatory Visit (HOSPITAL_COMMUNITY): Payer: BC Managed Care – PPO

## 2012-12-20 ENCOUNTER — Other Ambulatory Visit: Payer: Self-pay

## 2013-01-04 ENCOUNTER — Encounter (HOSPITAL_COMMUNITY): Payer: Self-pay

## 2013-01-04 ENCOUNTER — Ambulatory Visit (HOSPITAL_COMMUNITY)
Admission: RE | Admit: 2013-01-04 | Discharge: 2013-01-04 | Disposition: A | Discharge status: Home / Self Care | Payer: BC Managed Care – PPO | Source: Ambulatory Visit | Attending: Obstetrics and Gynecology | Admitting: Obstetrics and Gynecology

## 2013-01-04 VITALS — BP 115/68 | HR 94 | Wt 240.0 lb

## 2013-01-04 DIAGNOSIS — Z363 Encounter for antenatal screening for malformations: Secondary | ICD-10-CM | POA: Insufficient documentation

## 2013-01-04 DIAGNOSIS — O352XX0 Maternal care for (suspected) hereditary disease in fetus, not applicable or unspecified: Secondary | ICD-10-CM | POA: Insufficient documentation

## 2013-01-04 DIAGNOSIS — O09529 Supervision of elderly multigravida, unspecified trimester: Secondary | ICD-10-CM | POA: Insufficient documentation

## 2013-01-04 DIAGNOSIS — IMO0002 Reserved for concepts with insufficient information to code with codable children: Secondary | ICD-10-CM

## 2013-01-04 DIAGNOSIS — Z1389 Encounter for screening for other disorder: Secondary | ICD-10-CM | POA: Insufficient documentation

## 2013-01-04 DIAGNOSIS — O358XX0 Maternal care for other (suspected) fetal abnormality and damage, not applicable or unspecified: Secondary | ICD-10-CM | POA: Insufficient documentation

## 2013-01-04 DIAGNOSIS — Z0489 Encounter for examination and observation for other specified reasons: Secondary | ICD-10-CM

## 2013-01-04 NOTE — Progress Notes (Signed)
Heather Hicks was seen for ultrasound appointment today.  Please see [facility] report for details.

## 2013-02-01 ENCOUNTER — Encounter (HOSPITAL_COMMUNITY): Payer: Self-pay

## 2013-02-01 ENCOUNTER — Ambulatory Visit (HOSPITAL_COMMUNITY)
Admission: RE | Admit: 2013-02-01 | Discharge: 2013-02-01 | Disposition: A | Discharge status: Home / Self Care | Payer: BC Managed Care – PPO | Source: Ambulatory Visit | Attending: Obstetrics and Gynecology | Admitting: Obstetrics and Gynecology

## 2013-02-01 DIAGNOSIS — O3410 Maternal care for benign tumor of corpus uteri, unspecified trimester: Secondary | ICD-10-CM | POA: Insufficient documentation

## 2013-02-01 DIAGNOSIS — O352XX0 Maternal care for (suspected) hereditary disease in fetus, not applicable or unspecified: Secondary | ICD-10-CM | POA: Insufficient documentation

## 2013-02-01 DIAGNOSIS — O09529 Supervision of elderly multigravida, unspecified trimester: Secondary | ICD-10-CM

## 2013-02-01 DIAGNOSIS — IMO0002 Reserved for concepts with insufficient information to code with codable children: Secondary | ICD-10-CM

## 2013-02-01 DIAGNOSIS — Z0489 Encounter for examination and observation for other specified reasons: Secondary | ICD-10-CM

## 2013-02-01 NOTE — Progress Notes (Signed)
Heather Hicks  was seen today for an ultrasound appointment.  See full report in [facility].  Impression: Single IUP at 23 1/7 weeks Previous pregnancy with Trisomy 21- normal karyotype by amnio this pregnancy Normal interval anatomy - the anatomic fetal survey is now complete Multiple uterine myomas noted (see above) Interval growth is appropriate (56th %tile) Normal amniotic fluid volume  Recommendations: Recommend follow up ultrasound for interval growth and to reasses uterine myomas in 4-6 weeks.  Please contact our clinic if you would prefer to have this procedure performed here.  Alpha Gula, MD

## 2013-04-02 ENCOUNTER — Other Ambulatory Visit (HOSPITAL_COMMUNITY): Payer: Self-pay | Admitting: Obstetrics and Gynecology

## 2013-04-02 DIAGNOSIS — O358XX1 Maternal care for other (suspected) fetal abnormality and damage, fetus 1: Secondary | ICD-10-CM

## 2013-04-03 ENCOUNTER — Ambulatory Visit (HOSPITAL_COMMUNITY)
Admission: RE | Admit: 2013-04-03 | Discharge: 2013-04-03 | Disposition: A | Discharge status: Home / Self Care | Payer: BC Managed Care – PPO | Source: Ambulatory Visit | Attending: Obstetrics and Gynecology | Admitting: Obstetrics and Gynecology

## 2013-04-03 DIAGNOSIS — O358XX0 Maternal care for other (suspected) fetal abnormality and damage, not applicable or unspecified: Secondary | ICD-10-CM | POA: Insufficient documentation

## 2013-04-03 DIAGNOSIS — O358XX1 Maternal care for other (suspected) fetal abnormality and damage, fetus 1: Secondary | ICD-10-CM

## 2013-04-03 DIAGNOSIS — O3410 Maternal care for benign tumor of corpus uteri, unspecified trimester: Secondary | ICD-10-CM | POA: Insufficient documentation

## 2013-04-03 DIAGNOSIS — O09529 Supervision of elderly multigravida, unspecified trimester: Secondary | ICD-10-CM | POA: Insufficient documentation

## 2013-04-03 DIAGNOSIS — O352XX0 Maternal care for (suspected) hereditary disease in fetus, not applicable or unspecified: Secondary | ICD-10-CM | POA: Insufficient documentation

## 2013-04-12 ENCOUNTER — Other Ambulatory Visit (HOSPITAL_COMMUNITY): Payer: Self-pay | Admitting: Obstetrics and Gynecology

## 2013-04-12 DIAGNOSIS — O358XX0 Maternal care for other (suspected) fetal abnormality and damage, not applicable or unspecified: Secondary | ICD-10-CM

## 2013-04-12 DIAGNOSIS — O09529 Supervision of elderly multigravida, unspecified trimester: Secondary | ICD-10-CM

## 2013-04-17 ENCOUNTER — Ambulatory Visit (HOSPITAL_COMMUNITY)
Admission: RE | Admit: 2013-04-17 | Discharge: 2013-04-17 | Disposition: A | Discharge status: Home / Self Care | Payer: BC Managed Care – PPO | Source: Ambulatory Visit | Attending: Obstetrics and Gynecology | Admitting: Obstetrics and Gynecology

## 2013-04-17 VITALS — BP 129/61 | HR 100 | Wt 256.8 lb

## 2013-04-17 DIAGNOSIS — O358XX0 Maternal care for other (suspected) fetal abnormality and damage, not applicable or unspecified: Secondary | ICD-10-CM | POA: Insufficient documentation

## 2013-04-17 DIAGNOSIS — O09529 Supervision of elderly multigravida, unspecified trimester: Secondary | ICD-10-CM | POA: Insufficient documentation

## 2013-04-17 DIAGNOSIS — O3410 Maternal care for benign tumor of corpus uteri, unspecified trimester: Secondary | ICD-10-CM | POA: Insufficient documentation

## 2013-04-17 DIAGNOSIS — O352XX0 Maternal care for (suspected) hereditary disease in fetus, not applicable or unspecified: Secondary | ICD-10-CM | POA: Insufficient documentation

## 2013-04-17 NOTE — Progress Notes (Signed)
Heather Hicks  was seen today for an ultrasound appointment.  See full report in [facility].  Comments:  Impression: IUP at 33+6 weeks The previously noted fetal left pelvic mass is not appreciated on today's study- This was likely an ovarian cyst that ruptured spontaneously.  There is no evidence of ascites on today's study. Otherwise normal anatomy Multiple uterine fibroids as previously noted.  The findings and limitations of the study were discussed with the patient and her husband.  Recommendations: Recommend follow-up ultrasound examination in 2 weeks for growth and to reevaluate.  Alpha Gula, MD

## 2013-05-02 ENCOUNTER — Other Ambulatory Visit (HOSPITAL_COMMUNITY): Payer: Self-pay | Admitting: Maternal and Fetal Medicine

## 2013-05-02 DIAGNOSIS — O09529 Supervision of elderly multigravida, unspecified trimester: Secondary | ICD-10-CM

## 2013-05-02 DIAGNOSIS — O358XX0 Maternal care for other (suspected) fetal abnormality and damage, not applicable or unspecified: Secondary | ICD-10-CM

## 2013-05-03 ENCOUNTER — Encounter (HOSPITAL_COMMUNITY): Payer: Self-pay

## 2013-05-03 ENCOUNTER — Ambulatory Visit (HOSPITAL_COMMUNITY)
Admission: RE | Admit: 2013-05-03 | Discharge: 2013-05-03 | Disposition: A | Discharge status: Home / Self Care | Payer: BC Managed Care – PPO | Source: Ambulatory Visit | Attending: Obstetrics and Gynecology | Admitting: Obstetrics and Gynecology

## 2013-05-03 DIAGNOSIS — O09529 Supervision of elderly multigravida, unspecified trimester: Secondary | ICD-10-CM

## 2013-05-03 DIAGNOSIS — O358XX0 Maternal care for other (suspected) fetal abnormality and damage, not applicable or unspecified: Secondary | ICD-10-CM

## 2013-05-03 DIAGNOSIS — O3410 Maternal care for benign tumor of corpus uteri, unspecified trimester: Secondary | ICD-10-CM | POA: Insufficient documentation

## 2013-05-03 DIAGNOSIS — O352XX0 Maternal care for (suspected) hereditary disease in fetus, not applicable or unspecified: Secondary | ICD-10-CM | POA: Insufficient documentation

## 2013-05-22 ENCOUNTER — Encounter (HOSPITAL_COMMUNITY)
Admission: AD | Disposition: A | Discharge status: Home / Self Care | Payer: Self-pay | Source: Ambulatory Visit | Attending: Obstetrics and Gynecology

## 2013-05-22 ENCOUNTER — Encounter (HOSPITAL_COMMUNITY): Payer: Self-pay | Admitting: *Deleted

## 2013-05-22 ENCOUNTER — Encounter (HOSPITAL_COMMUNITY): Payer: Self-pay | Admitting: Anesthesiology

## 2013-05-22 ENCOUNTER — Inpatient Hospital Stay (HOSPITAL_COMMUNITY): Payer: BC Managed Care – PPO | Admitting: Anesthesiology

## 2013-05-22 ENCOUNTER — Inpatient Hospital Stay (HOSPITAL_COMMUNITY)
Admission: AD | Admit: 2013-05-22 | Discharge: 2013-05-26 | DRG: 371 | Disposition: A | Discharge status: Home / Self Care | Payer: BC Managed Care – PPO | Source: Ambulatory Visit | Attending: Obstetrics and Gynecology | Admitting: Obstetrics and Gynecology

## 2013-05-22 DIAGNOSIS — O34599 Maternal care for other abnormalities of gravid uterus, unspecified trimester: Secondary | ICD-10-CM | POA: Diagnosis present

## 2013-05-22 DIAGNOSIS — D4959 Neoplasm of unspecified behavior of other genitourinary organ: Secondary | ICD-10-CM | POA: Diagnosis present

## 2013-05-22 DIAGNOSIS — O09529 Supervision of elderly multigravida, unspecified trimester: Secondary | ICD-10-CM | POA: Diagnosis present

## 2013-05-22 DIAGNOSIS — D259 Leiomyoma of uterus, unspecified: Secondary | ICD-10-CM | POA: Diagnosis present

## 2013-05-22 LAB — CBC
Hemoglobin: 11.6 g/dL — ABNORMAL LOW (ref 12.0–15.0)
MCH: 25.8 pg — ABNORMAL LOW (ref 26.0–34.0)
MCHC: 32.1 g/dL (ref 30.0–36.0)
Platelets: 248 10*3/uL (ref 150–400)
RBC: 4.49 MIL/uL (ref 3.87–5.11)

## 2013-05-22 LAB — TYPE AND SCREEN: Antibody Screen: NEGATIVE

## 2013-05-22 LAB — RPR: RPR Ser Ql: NONREACTIVE

## 2013-05-22 SURGERY — Surgical Case
Anesthesia: Epidural | Site: Abdomen | Wound class: Clean Contaminated

## 2013-05-22 MED ORDER — KETOROLAC TROMETHAMINE 60 MG/2ML IM SOLN
INTRAMUSCULAR | Status: AC
  Filled 2013-05-22: qty 2

## 2013-05-22 MED ORDER — LACTATED RINGERS IV SOLN
INTRAVENOUS | Status: DC
  Administered 2013-05-22 (×3): via INTRAVENOUS

## 2013-05-22 MED ORDER — SCOPOLAMINE 1 MG/3DAYS TD PT72
MEDICATED_PATCH | TRANSDERMAL | Status: AC
  Filled 2013-05-22: qty 1

## 2013-05-22 MED ORDER — MEPERIDINE HCL 25 MG/ML IJ SOLN
6.2500 mg | INTRAMUSCULAR | Status: DC | PRN
  Administered 2013-05-22: 6.25 mg via INTRAVENOUS

## 2013-05-22 MED ORDER — OXYTOCIN 10 UNIT/ML IJ SOLN
40.0000 [IU] | INTRAVENOUS | Status: DC | PRN
  Administered 2013-05-22: 40 [IU] via INTRAVENOUS

## 2013-05-22 MED ORDER — LACTATED RINGERS IV SOLN: 500.0000 mL | Freq: Once | INTRAVENOUS | Status: DC

## 2013-05-22 MED ORDER — OXYTOCIN BOLUS FROM INFUSION: 500.0000 mL | INTRAVENOUS | Status: DC

## 2013-05-22 MED ORDER — ONDANSETRON HCL 4 MG/2ML IJ SOLN
INTRAMUSCULAR | Status: DC | PRN
  Administered 2013-05-22: 4 mg via INTRAVENOUS

## 2013-05-22 MED ORDER — SODIUM BICARBONATE 8.4 % IV SOLN
INTRAVENOUS | Status: DC | PRN
  Administered 2013-05-22 (×4): 5 mL via EPIDURAL

## 2013-05-22 MED ORDER — ACETAMINOPHEN 325 MG PO TABS
650.0000 mg | ORAL_TABLET | ORAL | Status: DC | PRN
  Administered 2013-05-22: 650 mg via ORAL
  Filled 2013-05-22: qty 2

## 2013-05-22 MED ORDER — PHENYLEPHRINE 40 MCG/ML (10ML) SYRINGE FOR IV PUSH (FOR BLOOD PRESSURE SUPPORT): 80.0000 ug | PREFILLED_SYRINGE | INTRAVENOUS | Status: DC | PRN

## 2013-05-22 MED ORDER — LACTATED RINGERS IV SOLN
INTRAVENOUS | Status: DC | PRN
  Administered 2013-05-22: 23:00:00 via INTRAVENOUS

## 2013-05-22 MED ORDER — DIPHENHYDRAMINE HCL 50 MG/ML IJ SOLN: 12.5000 mg | INTRAMUSCULAR | Status: DC | PRN

## 2013-05-22 MED ORDER — MORPHINE SULFATE (PF) 0.5 MG/ML IJ SOLN
INTRAMUSCULAR | Status: DC | PRN
  Administered 2013-05-22: 1 mg via INTRAVENOUS

## 2013-05-22 MED ORDER — LACTATED RINGERS IV SOLN
INTRAVENOUS | Status: DC
  Administered 2013-05-22: 21:00:00 via INTRAUTERINE

## 2013-05-22 MED ORDER — 0.9 % SODIUM CHLORIDE (POUR BTL) OPTIME
TOPICAL | Status: DC | PRN
  Administered 2013-05-22: 1000 mL

## 2013-05-22 MED ORDER — FENTANYL 2.5 MCG/ML BUPIVACAINE 1/10 % EPIDURAL INFUSION (WH - ANES)
14.0000 mL/h | INTRAMUSCULAR | Status: DC | PRN
  Administered 2013-05-22: 14 mL/h via EPIDURAL
  Filled 2013-05-22 (×2): qty 125

## 2013-05-22 MED ORDER — IBUPROFEN 600 MG PO TABS: 600.0000 mg | ORAL_TABLET | Freq: Four times a day (QID) | ORAL | Status: DC | PRN

## 2013-05-22 MED ORDER — TERBUTALINE SULFATE 1 MG/ML IJ SOLN: 0.2500 mg | Freq: Once | INTRAMUSCULAR | Status: DC | PRN

## 2013-05-22 MED ORDER — OXYTOCIN 40 UNITS IN LACTATED RINGERS INFUSION - SIMPLE MED
62.5000 mL/h | INTRAVENOUS | Status: DC
  Filled 2013-05-22: qty 1000

## 2013-05-22 MED ORDER — LACTATED RINGERS IV SOLN: 500.0000 mL | INTRAVENOUS | Status: DC | PRN

## 2013-05-22 MED ORDER — CITRIC ACID-SODIUM CITRATE 334-500 MG/5ML PO SOLN
30.0000 mL | ORAL | Status: DC | PRN
  Administered 2013-05-22: 30 mL via ORAL
  Filled 2013-05-22: qty 15

## 2013-05-22 MED ORDER — DEXTROSE 5 % IV SOLN
3.0000 g | INTRAVENOUS | Status: DC | PRN
  Administered 2013-05-22: 3 g via INTRAVENOUS

## 2013-05-22 MED ORDER — LIDOCAINE HCL (PF) 1 % IJ SOLN: 30.0000 mL | INTRAMUSCULAR | Status: DC | PRN

## 2013-05-22 MED ORDER — KETOROLAC TROMETHAMINE 60 MG/2ML IM SOLN
60.0000 mg | Freq: Once | INTRAMUSCULAR | Status: AC | PRN
  Administered 2013-05-22: 60 mg via INTRAMUSCULAR

## 2013-05-22 MED ORDER — EPHEDRINE 5 MG/ML INJ
10.0000 mg | INTRAVENOUS | Status: DC | PRN
  Filled 2013-05-22: qty 4

## 2013-05-22 MED ORDER — OXYTOCIN 40 UNITS IN LACTATED RINGERS INFUSION - SIMPLE MED
1.0000 m[IU]/min | INTRAVENOUS | Status: DC
  Administered 2013-05-22: 2 m[IU]/min via INTRAVENOUS

## 2013-05-22 MED ORDER — FLEET ENEMA 7-19 GM/118ML RE ENEM: 1.0000 | ENEMA | RECTAL | Status: DC | PRN

## 2013-05-22 MED ORDER — MORPHINE SULFATE (PF) 0.5 MG/ML IJ SOLN
INTRAMUSCULAR | Status: DC | PRN
  Administered 2013-05-22: 4 mg via EPIDURAL

## 2013-05-22 MED ORDER — PHENYLEPHRINE 40 MCG/ML (10ML) SYRINGE FOR IV PUSH (FOR BLOOD PRESSURE SUPPORT)
80.0000 ug | PREFILLED_SYRINGE | INTRAVENOUS | Status: DC | PRN
  Filled 2013-05-22: qty 5

## 2013-05-22 MED ORDER — MEPERIDINE HCL 25 MG/ML IJ SOLN
INTRAMUSCULAR | Status: DC | PRN
  Administered 2013-05-22 (×2): 12.5 mg via INTRAVENOUS

## 2013-05-22 MED ORDER — MEPERIDINE HCL 25 MG/ML IJ SOLN
INTRAMUSCULAR | Status: AC
  Filled 2013-05-22: qty 1

## 2013-05-22 MED ORDER — SCOPOLAMINE 1 MG/3DAYS TD PT72
1.0000 | MEDICATED_PATCH | Freq: Once | TRANSDERMAL | Status: AC
  Administered 2013-05-22: 1.5 mg via TRANSDERMAL

## 2013-05-22 MED ORDER — ONDANSETRON HCL 4 MG/2ML IJ SOLN
4.0000 mg | Freq: Four times a day (QID) | INTRAMUSCULAR | Status: DC | PRN
  Administered 2013-05-22: 4 mg via INTRAVENOUS
  Filled 2013-05-22: qty 2

## 2013-05-22 MED ORDER — EPHEDRINE 5 MG/ML INJ: 10.0000 mg | INTRAVENOUS | Status: DC | PRN

## 2013-05-22 MED ORDER — OXYCODONE-ACETAMINOPHEN 5-325 MG PO TABS: 1.0000 | ORAL_TABLET | ORAL | Status: DC | PRN

## 2013-05-22 MED ORDER — FENTANYL CITRATE 0.05 MG/ML IJ SOLN: 25.0000 ug | INTRAMUSCULAR | Status: DC | PRN

## 2013-05-22 SURGICAL SUPPLY — 34 items
ADH SKN CLS APL DERMABOND .7 (GAUZE/BANDAGES/DRESSINGS) ×1
CLAMP CORD UMBIL (MISCELLANEOUS) ×1 IMPLANT
CLOTH BEACON ORANGE TIMEOUT ST (SAFETY) ×2 IMPLANT
DERMABOND ADVANCED (GAUZE/BANDAGES/DRESSINGS) ×1
DERMABOND ADVANCED .7 DNX12 (GAUZE/BANDAGES/DRESSINGS) IMPLANT
DRAPE LG THREE QUARTER DISP (DRAPES) ×2 IMPLANT
DRSG OPSITE POSTOP 4X10 (GAUZE/BANDAGES/DRESSINGS) ×2 IMPLANT
DURAPREP 26ML APPLICATOR (WOUND CARE) ×1 IMPLANT
ELECT REM PT RETURN 9FT ADLT (ELECTROSURGICAL) ×2
ELECTRODE REM PT RTRN 9FT ADLT (ELECTROSURGICAL) ×1 IMPLANT
EXTRACTOR VACUUM BELL STYLE (SUCTIONS) IMPLANT
GLOVE BIO SURGEON STRL SZ7 (GLOVE) ×4 IMPLANT
GOWN STRL REIN XL XLG (GOWN DISPOSABLE) ×4 IMPLANT
KIT ABG SYR 3ML LUER SLIP (SYRINGE) ×1 IMPLANT
NDL HYPO 25X5/8 SAFETYGLIDE (NEEDLE) IMPLANT
NEEDLE HYPO 25X5/8 SAFETYGLIDE (NEEDLE) ×2 IMPLANT
NS IRRIG 1000ML POUR BTL (IV SOLUTION) ×2 IMPLANT
PACK C SECTION WH (CUSTOM PROCEDURE TRAY) ×2 IMPLANT
PAD OB MATERNITY 4.3X12.25 (PERSONAL CARE ITEMS) ×2 IMPLANT
RETRACTOR WND ALEXIS 25 LRG (MISCELLANEOUS) ×1 IMPLANT
RTRCTR WOUND ALEXIS 25CM LRG (MISCELLANEOUS) ×2
STAPLER VISISTAT 35W (STAPLE) IMPLANT
SUT MNCRL 0 VIOLET CTX 36 (SUTURE) ×2 IMPLANT
SUT MONOCRYL 0 CTX 36 (SUTURE) ×3
SUT PDS AB 0 CTX 60 (SUTURE) IMPLANT
SUT PLAIN 2 0 XLH (SUTURE) ×1 IMPLANT
SUT VIC AB 0 CT1 27 (SUTURE) ×4
SUT VIC AB 0 CT1 27XBRD ANBCTR (SUTURE) ×2 IMPLANT
SUT VIC AB 2-0 CT1 27 (SUTURE) ×2
SUT VIC AB 2-0 CT1 TAPERPNT 27 (SUTURE) ×1 IMPLANT
SUT VIC AB 4-0 KS 27 (SUTURE) ×1 IMPLANT
TOWEL OR 17X24 6PK STRL BLUE (TOWEL DISPOSABLE) ×2 IMPLANT
TRAY FOLEY CATH 14FR (SET/KITS/TRAYS/PACK) ×1 IMPLANT
WATER STERILE IRR 1000ML POUR (IV SOLUTION) ×1 IMPLANT

## 2013-05-22 NOTE — Progress Notes (Signed)
Pt had made progress to 7 cm.  At 1930 she had SROM clear fluid.  Shortly after the [facility] started with moderate to severe variables with most contractions.  Amnio infusion started and variables went away for a short period but moderate variables restarted at 2100.  At 2130 they again became severe GSTV and NST R in between with normal baseline of 140-150s.  The severe variables have continued and at 2149 the pt was counseled for proceeding with LTCS.  All R/B/Alt d/w patient and she agrees to proceed.

## 2013-05-22 NOTE — Transfer of Care (Signed)
Immediate Anesthesia Transfer of Care Note  Patient: Heather Hicks  Procedure(s) Performed: Procedure(s): Primary CESAREAN SECTION with Delivery Baby Girl @ 2230, Apgars 8/9 (N/A)  Patient Location: PACU  Anesthesia Type:Epidural  Level of Consciousness: awake  Airway & Oxygen Therapy: Patient Spontanous Breathing  Post-op Assessment: Report given to PACU RN and Post -op Vital signs reviewed and stable  Post vital signs: stable  Complications: No apparent anesthesia complications

## 2013-05-22 NOTE — Anesthesia Preprocedure Evaluation (Addendum)
Anesthesia Evaluation  Patient identified by MRN, date of birth, ID band Patient awake    Reviewed: Allergy & Precautions, H&P , Patient's Chart, lab work & pertinent test results  Airway Mallampati: II TM Distance: >3 FB Neck ROM: full    Dental  (+) Teeth Intact   Pulmonary  breath sounds clear to auscultation        Cardiovascular Rhythm:regular Rate:Normal     Neuro/Psych  Headaches,    GI/Hepatic GERD-  Medicated,  Endo/Other  Morbid obesity  Renal/GU      Musculoskeletal   Abdominal   Peds  Hematology   Anesthesia Other Findings       Reproductive/Obstetrics (+) Pregnancy (NRFS --> C/S)                          Anesthesia Physical Anesthesia Plan  ASA: III and emergent  Anesthesia Plan: Epidural   Post-op Pain Management:    Induction:   Airway Management Planned:   Additional Equipment:   Intra-op Plan:   Post-operative Plan:   Informed Consent: I have reviewed the patients History and Physical, chart, labs and discussed the procedure including the risks, benefits and alternatives for the proposed anesthesia with the patient or authorized representative who has indicated his/her understanding and acceptance.   Dental Advisory Given  Plan Discussed with: Surgeon and CRNA  Anesthesia Plan Comments: (Labs checked- platelets confirmed with RN in room. Fetal heart tracing, per RN, reported to be stable enough for sitting procedure. Discussed epidural, and patient consents to the procedure:  included risk of possible headache,backache, failed block, allergic reaction, and nerve injury. This patient was asked if she had any questions or concerns before the procedure started. )       Anesthesia Quick Evaluation

## 2013-05-22 NOTE — Op Note (Signed)
05/22/2013  11:12 PM  PATIENT:  Heather Hicks  37 y.o. female  PRE-OPERATIVE DIAGNOSIS:  Nonreassuring Fetal Heart Rate   POST-OPERATIVE DIAGNOSIS:  Nonreassuring Fetal Heart Rate  PROCEDURE:  Procedure(s): Primary CESAREAN SECTION with Delivery Baby Girl @ 2230, Apgars 8/9 (N/A)  SURGEON:  Surgeon(s) and Role:    * Salam Micucci A Ezrah Dembeck, MD - Primary   ANESTHESIA:   epidural  EBL:  Total I/O In: 2000 [I.V.:2000] Out: 775 [Urine:75; Blood:700]   SPECIMEN:  No Specimen  DISPOSITION OF SPECIMEN:  N/A  COUNTS:  YES  TOURNIQUET:  * No tourniquets in log *  DICTATION: .Note written in EPIC  PLAN OF CARE: Admit to inpatient   PATIENT DISPOSITION:  PACU - hemodynamically stable.   Delay start of Pharmacological VTE agent (>24hrs) due to surgical blood loss or risk of bleeding: not applicable  Complications:  none Medications:  Ancef, Pitocin Findings:  Baby female, Apgars 8,9, weight P.   Normal tubes, ovaries and uterus seen.  Cord Ph 7.26  Technique:  After adequate epidural anesthesia was achieved, the patient was prepped and draped in usual sterile fashion.  A foley catheter was used to drain the bladder.  A pfannanstiel incision was made with the scalpel and carried down to the fascia with the bovie cautery. The fascia was incised in the midline with the scalpel and carried in a transverse curvilinear manner bilaterally.  The fascia was reflected superiorly and inferiorly off the rectus muscles and the muscles split in the midline.  A bowel free portion of the peritoneum was entered bluntly and then extended in a superior and inferior manner with good visualization of the bowel and bladder.  The Alexis instrument was then placed and the vesico-uterine fascia tented up and incised in a transverse curvilinear manner.  A 2 cm transverse incision was made in the upper portion of the lower uterine segment until the amnion was exposed.   The incision was extended transversely in a  blunt manner.  Clear fluid was noted and the baby delivered in the vertex presentation without complication after extending the uterine incision with the bandage scissors to deliver the shoulders.  The baby was bulb suctioned and the cord was clamped and cut.  The baby was then handed to awaiting Neonatology.  The placenta was then delivered manually and the uterus cleared of all debris.  The uterine incision was then closed with a running lock stitch of 0 monocryl.  An imbricating layer of 0 monocryl was closed as well. Good hemostasis of the uterine incision was achieved and the abdomen was cleared with irrigation.  The peritoneum was closed with a running stitch of 2-0 vicryl.  This incorporated the rectus muscles as a separate layer.  The fascia was then closed with a running stitch of 0 vicryl.  The subcutaneous layer was closed with interrupted  stitches of 2-0 plain gut.  The skin was closed with 4-0 vicryl on a keith needle and dermabond.  The patient tolerated the procedure well and was returned to the recovery room in stable condition.  All counts were correct times three.  Mckenzey Parcell A   

## 2013-05-22 NOTE — MAU Note (Addendum)
Patient presents today with c/o of contractions since Sunday; states has been anywhere from 5 to 20 minutes apart. Denies bleeding , LOF. Does states lost mucus plug. Last check in office last Monday and was 2cm.

## 2013-05-22 NOTE — Anesthesia Procedure Notes (Signed)

## 2013-05-22 NOTE — Brief Op Note (Signed)
05/22/2013  11:12 PM  PATIENT:  Heather Hicks  37 y.o. female  PRE-OPERATIVE DIAGNOSIS:  Nonreassuring Fetal Heart Rate   POST-OPERATIVE DIAGNOSIS:  Nonreassuring Fetal Heart Rate  PROCEDURE:  Procedure(s): Primary CESAREAN SECTION with Delivery Baby Girl @ 2230, Apgars 8/9 (N/A)  SURGEON:  Surgeon(s) and Role:    * Loney Laurence, MD - Primary   ANESTHESIA:   epidural  EBL:  Total I/O In: 2000 [I.V.:2000] Out: 775 [Urine:75; Blood:700]   SPECIMEN:  No Specimen  DISPOSITION OF SPECIMEN:  N/A  COUNTS:  YES  TOURNIQUET:  * No tourniquets in log *  DICTATION: .Note written in EPIC  PLAN OF CARE: Admit to inpatient   PATIENT DISPOSITION:  PACU - hemodynamically stable.   Delay start of Pharmacological VTE agent (>24hrs) due to surgical blood loss or risk of bleeding: not applicable  Complications:  none Medications:  Ancef, Pitocin Findings:  Baby female, Apgars 8,9, weight P.   Normal tubes, ovaries and uterus seen.  Cord Ph 7.26  Technique:  After adequate epidural anesthesia was achieved, the patient was prepped and draped in usual sterile fashion.  A foley catheter was used to drain the bladder.  A pfannanstiel incision was made with the scalpel and carried down to the fascia with the bovie cautery. The fascia was incised in the midline with the scalpel and carried in a transverse curvilinear manner bilaterally.  The fascia was reflected superiorly and inferiorly off the rectus muscles and the muscles split in the midline.  A bowel free portion of the peritoneum was entered bluntly and then extended in a superior and inferior manner with good visualization of the bowel and bladder.  The Alexis instrument was then placed and the vesico-uterine fascia tented up and incised in a transverse curvilinear manner.  A 2 cm transverse incision was made in the upper portion of the lower uterine segment until the amnion was exposed.   The incision was extended transversely in a  blunt manner.  Clear fluid was noted and the baby delivered in the vertex presentation without complication after extending the uterine incision with the bandage scissors to deliver the shoulders.  The baby was bulb suctioned and the cord was clamped and cut.  The baby was then handed to awaiting Neonatology.  The placenta was then delivered manually and the uterus cleared of all debris.  The uterine incision was then closed with a running lock stitch of 0 monocryl.  An imbricating layer of 0 monocryl was closed as well. Good hemostasis of the uterine incision was achieved and the abdomen was cleared with irrigation.  The peritoneum was closed with a running stitch of 2-0 vicryl.  This incorporated the rectus muscles as a separate layer.  The fascia was then closed with a running stitch of 0 vicryl.  The subcutaneous layer was closed with interrupted  stitches of 2-0 plain gut.  The skin was closed with 4-0 vicryl on a keith needle and dermabond.  The patient tolerated the procedure well and was returned to the recovery room in stable condition.  All counts were correct times three.  Mckenzye Cutright A

## 2013-05-22 NOTE — H&P (Signed)
37 y.o. [redacted]w[redacted]d  G3P0020 comes in c/o labor.  Otherwise has good fetal movement and no bleeding.  Past Medical History  Diagnosis Date  . Rubella 2006    rubella positive  . Migraine   . 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 Tonsillectomy  1989  . Gallbladder surgery  2009  . Melanoma  2008    removed from neck    OB History  Gravida Para Term Preterm AB SAB TAB Ectopic Multiple Living  3 0 0 0 2 1 1 0 0 0     # Outcome Date GA Lbr Len/2nd Weight Sex Delivery Anes PTL Lv  3 CUR           2 SAB           1 TAB               History   Social History  . Marital Status: Married    Spouse Name: N/A    Number of Children: N/A  . Years of Education: N/A   Occupational History  . Not on file.   Social History Main Topics  . Smoking status: Never Smoker   . Smokeless tobacco: Not on file  . Alcohol Use: No  . Drug Use: No  . Sexual Activity: Yes    Birth Control/ Protection: None   Other Topics Concern  . Not on file   Social History Narrative  . No narrative on file   Shellfish allergy and Penicillins    Prenatal Transfer Tool  Maternal Diabetes: No Genetic Screening: Normal- Amnio 16 XX Maternal Ultrasounds/Referrals: none  Fetal Ultrasounds or other Referrals:  Referred to Materal Fetal Medicine - no cystic structure in abdomen seen on f/u, multiple maternal fibroids Maternal Substance Abuse:  No Significant Maternal Medications:  Meds include: Other:  Significant Maternal Lab Results: None  Other PNC:    Filed Vitals:   05/22/13 1620  BP: 124/63  Pulse: 88  Temp:   Resp:      Lungs/Cor:  NAD Abdomen:  soft, gravid Ex:  no cords, erythema SVE:  5/C/-2 FHTs:  140s, good STV, NST R Toco:  q3-5   A/P   Term labor.  GBS neg.  Female Iafrate A

## 2013-05-23 ENCOUNTER — Encounter (HOSPITAL_COMMUNITY): Payer: Self-pay | Admitting: *Deleted

## 2013-05-23 LAB — CBC
HCT: 33.3 % — ABNORMAL LOW (ref 36.0–46.0)
Hemoglobin: 10.4 g/dL — ABNORMAL LOW (ref 12.0–15.0)
MCHC: 31.2 g/dL (ref 30.0–36.0)
MCV: 81.8 fL (ref 78.0–100.0)
RDW: 15.1 % (ref 11.5–15.5)

## 2013-05-23 MED ORDER — ZOLPIDEM TARTRATE 5 MG PO TABS: 5.0000 mg | ORAL_TABLET | Freq: Every evening | ORAL | Status: DC | PRN

## 2013-05-23 MED ORDER — DIPHENHYDRAMINE HCL 50 MG/ML IJ SOLN: 25.0000 mg | INTRAMUSCULAR | Status: DC | PRN

## 2013-05-23 MED ORDER — KETOROLAC TROMETHAMINE 30 MG/ML IJ SOLN: 30.0000 mg | Freq: Four times a day (QID) | INTRAMUSCULAR | Status: AC | PRN

## 2013-05-23 MED ORDER — ONDANSETRON HCL 4 MG/2ML IJ SOLN: 4.0000 mg | INTRAMUSCULAR | Status: DC | PRN

## 2013-05-23 MED ORDER — MENTHOL 3 MG MT LOZG: 1.0000 | LOZENGE | OROMUCOSAL | Status: DC | PRN

## 2013-05-23 MED ORDER — FLEET ENEMA 7-19 GM/118ML RE ENEM: 1.0000 | ENEMA | Freq: Every day | RECTAL | Status: DC | PRN

## 2013-05-23 MED ORDER — PRENATAL MULTIVITAMIN CH
1.0000 | ORAL_TABLET | Freq: Every day | ORAL | Status: DC
  Administered 2013-05-23 – 2013-05-25 (×3): 1 via ORAL
  Filled 2013-05-23 (×5): qty 1

## 2013-05-23 MED ORDER — ONDANSETRON HCL 4 MG/2ML IJ SOLN: 4.0000 mg | Freq: Three times a day (TID) | INTRAMUSCULAR | Status: DC | PRN

## 2013-05-23 MED ORDER — NALBUPHINE SYRINGE 5 MG/0.5 ML
5.0000 mg | INJECTION | INTRAMUSCULAR | Status: DC | PRN
  Filled 2013-05-23: qty 1

## 2013-05-23 MED ORDER — SIMETHICONE 80 MG PO CHEW: 80.0000 mg | CHEWABLE_TABLET | ORAL | Status: DC | PRN

## 2013-05-23 MED ORDER — DIBUCAINE 1 % RE OINT: 1.0000 "application " | TOPICAL_OINTMENT | RECTAL | Status: DC | PRN

## 2013-05-23 MED ORDER — LACTATED RINGERS IV SOLN
INTRAVENOUS | Status: AC
  Administered 2013-05-23: 17:00:00 via INTRAVENOUS

## 2013-05-23 MED ORDER — TETANUS-DIPHTH-ACELL PERTUSSIS 5-2.5-18.5 LF-MCG/0.5 IM SUSP: 0.5000 mL | Freq: Once | INTRAMUSCULAR | Status: DC

## 2013-05-23 MED ORDER — PANTOPRAZOLE SODIUM 40 MG PO TBEC
40.0000 mg | DELAYED_RELEASE_TABLET | Freq: Every day | ORAL | Status: DC
  Administered 2013-05-25: 40 mg via ORAL
  Filled 2013-05-23 (×4): qty 1

## 2013-05-23 MED ORDER — SIMETHICONE 80 MG PO CHEW
80.0000 mg | CHEWABLE_TABLET | Freq: Three times a day (TID) | ORAL | Status: DC
  Administered 2013-05-23 – 2013-05-26 (×13): 80 mg via ORAL

## 2013-05-23 MED ORDER — OXYTOCIN 40 UNITS IN LACTATED RINGERS INFUSION - SIMPLE MED: 62.5000 mL/h | INTRAVENOUS | Status: AC

## 2013-05-23 MED ORDER — SODIUM CHLORIDE 0.9 % IJ SOLN: 3.0000 mL | INTRAMUSCULAR | Status: DC | PRN

## 2013-05-23 MED ORDER — ONDANSETRON HCL 4 MG PO TABS: 4.0000 mg | ORAL_TABLET | ORAL | Status: DC | PRN

## 2013-05-23 MED ORDER — METOCLOPRAMIDE HCL 5 MG/ML IJ SOLN: 10.0000 mg | Freq: Three times a day (TID) | INTRAMUSCULAR | Status: DC | PRN

## 2013-05-23 MED ORDER — KETOROLAC TROMETHAMINE 30 MG/ML IJ SOLN
30.0000 mg | Freq: Four times a day (QID) | INTRAMUSCULAR | Status: AC | PRN
  Administered 2013-05-23: 30 mg via INTRAVENOUS
  Filled 2013-05-23: qty 1

## 2013-05-23 MED ORDER — DIPHENHYDRAMINE HCL 25 MG PO CAPS: 25.0000 mg | ORAL_CAPSULE | Freq: Four times a day (QID) | ORAL | Status: DC | PRN

## 2013-05-23 MED ORDER — MEASLES, MUMPS & RUBELLA VAC ~~LOC~~ INJ
0.5000 mL | INJECTION | Freq: Once | SUBCUTANEOUS | Status: DC
  Filled 2013-05-23: qty 0.5

## 2013-05-23 MED ORDER — DIPHENHYDRAMINE HCL 50 MG/ML IJ SOLN: 12.5000 mg | INTRAMUSCULAR | Status: DC | PRN

## 2013-05-23 MED ORDER — IBUPROFEN 600 MG PO TABS
600.0000 mg | ORAL_TABLET | Freq: Four times a day (QID) | ORAL | Status: DC
  Administered 2013-05-23 – 2013-05-26 (×12): 600 mg via ORAL
  Filled 2013-05-23 (×12): qty 1

## 2013-05-23 MED ORDER — LACTATED RINGERS IV BOLUS (SEPSIS): 1000.0000 mL | Freq: Once | INTRAVENOUS | Status: DC

## 2013-05-23 MED ORDER — BISACODYL 10 MG RE SUPP
10.0000 mg | Freq: Every day | RECTAL | Status: DC | PRN
Start: 2013-05-23 — End: 2013-05-26

## 2013-05-23 MED ORDER — METHYLERGONOVINE MALEATE 0.2 MG PO TABS: 0.2000 mg | ORAL_TABLET | ORAL | Status: DC | PRN

## 2013-05-23 MED ORDER — FERROUS SULFATE 325 (65 FE) MG PO TABS
325.0000 mg | ORAL_TABLET | Freq: Two times a day (BID) | ORAL | Status: DC
  Administered 2013-05-23 – 2013-05-26 (×5): 325 mg via ORAL
  Filled 2013-05-23 (×5): qty 1

## 2013-05-23 MED ORDER — SENNOSIDES-DOCUSATE SODIUM 8.6-50 MG PO TABS
2.0000 | ORAL_TABLET | Freq: Every day | ORAL | Status: DC
  Administered 2013-05-23 – 2013-05-25 (×3): 2 via ORAL

## 2013-05-23 MED ORDER — NALOXONE HCL 1 MG/ML IJ SOLN
1.0000 ug/kg/h | INTRAVENOUS | Status: DC | PRN
  Filled 2013-05-23: qty 2

## 2013-05-23 MED ORDER — METHYLERGONOVINE MALEATE 0.2 MG/ML IJ SOLN: 0.2000 mg | INTRAMUSCULAR | Status: DC | PRN

## 2013-05-23 MED ORDER — WITCH HAZEL-GLYCERIN EX PADS: 1.0000 "application " | MEDICATED_PAD | CUTANEOUS | Status: DC | PRN

## 2013-05-23 MED ORDER — LACTATED RINGERS IV SOLN
INTRAVENOUS | Status: DC
  Administered 2013-05-23: 08:00:00 via INTRAVENOUS

## 2013-05-23 MED ORDER — LANOLIN HYDROUS EX OINT: 1.0000 "application " | TOPICAL_OINTMENT | CUTANEOUS | Status: DC | PRN

## 2013-05-23 MED ORDER — DIPHENHYDRAMINE HCL 25 MG PO CAPS
25.0000 mg | ORAL_CAPSULE | ORAL | Status: DC | PRN
  Administered 2013-05-23: 25 mg via ORAL
  Filled 2013-05-23: qty 1

## 2013-05-23 MED ORDER — NALOXONE HCL 0.4 MG/ML IJ SOLN: 0.4000 mg | INTRAMUSCULAR | Status: DC | PRN

## 2013-05-23 MED ORDER — OXYCODONE-ACETAMINOPHEN 5-325 MG PO TABS
1.0000 | ORAL_TABLET | ORAL | Status: DC | PRN
  Administered 2013-05-23 – 2013-05-25 (×8): 1 via ORAL
  Administered 2013-05-25 (×2): 2 via ORAL
  Administered 2013-05-25: 1 via ORAL
  Administered 2013-05-25: 2 via ORAL
  Administered 2013-05-26 (×3): 1 via ORAL
  Filled 2013-05-23: qty 1
  Filled 2013-05-23 (×2): qty 2
  Filled 2013-05-23 (×5): qty 1
  Filled 2013-05-23: qty 2
  Filled 2013-05-23 (×6): qty 1

## 2013-05-23 MED ORDER — FAMOTIDINE 20 MG PO TABS
10.0000 mg | ORAL_TABLET | Freq: Two times a day (BID) | ORAL | Status: DC
  Administered 2013-05-23: 20 mg via ORAL
  Administered 2013-05-25: 21:00:00 via ORAL
  Administered 2013-05-25 – 2013-05-26 (×2): 10 mg via ORAL
  Filled 2013-05-23 (×4): qty 1

## 2013-05-23 NOTE — Anesthesia Postprocedure Evaluation (Signed)
  Anesthesia Post-op Note  Anesthesia Post Note  Patient: Heather Hicks  Procedure(s) Performed: Procedure(s) (LRB): Primary CESAREAN SECTION with Delivery Baby Girl @ 2230, Apgars 8/9 (N/A)  Anesthesia type: Epidural  Patient location: PACU  Post pain: Pain level controlled  Post assessment: Post-op Vital signs reviewed  Last Vitals:  Filed Vitals:   05/23/13 0120  BP: 111/69  Pulse: 78  Temp: 36.9 C  Resp: 20    Post vital signs: stable  Level of consciousness: awake  Complications: No apparent anesthesia complications

## 2013-05-23 NOTE — Anesthesia Postprocedure Evaluation (Signed)
  Anesthesia Post-op Note  Patient: Heather Hicks  Procedure(s) Performed: Procedure(s): Primary CESAREAN SECTION with Delivery Baby Girl @ 2230, Apgars 8/9 (N/A)  Patient Location: Mother/Baby  Anesthesia Type:Epidural  Level of Consciousness: awake  Airway and Oxygen Therapy: Patient Spontanous Breathing  Post-op Pain: mild  Post-op Assessment: Patient's Cardiovascular Status Stable and Respiratory Function Stable  Post-op Vital Signs: stable  Complications: No apparent anesthesia complications

## 2013-05-23 NOTE — Progress Notes (Signed)
Patient has not yet eaten, foley is in, has not yet been OOB. Pain control is good.  No complaints.   Filed Vitals:   05/23/13 0420 05/23/13 0620 05/23/13 0630 05/23/13 0850  BP: 102/77 96/64 100/69 118/74  Pulse: 74 89 88 88  Temp: 98.2 F (36.8 C) 99.3 F (37.4 C)  99.2 F (37.3 C)  TempSrc: Oral Oral Oral Oral  Resp: 20 20 20 20   Height:      Weight:      SpO2: 92% 96% 97% 95%    [employer]: c/d/i No CT  Lab Results  Component Value Date   WBC 15.7* 05/23/2013   HGB 10.4* 05/23/2013   HCT 33.3* 05/23/2013   MCV 81.8 05/23/2013   PLT 212 05/23/2013    --/--/AB POS (09/02 1420)  A/P Post op day #1 s/p c/s for NRFHT Doing well.  Routine care.     Heather Hicks

## 2013-05-24 ENCOUNTER — Encounter (HOSPITAL_COMMUNITY): Payer: Self-pay | Admitting: Obstetrics and Gynecology

## 2013-05-24 NOTE — Progress Notes (Signed)
Subjective: Postpartum Day 2: Cesarean Delivery Patient reports pain controlled, no N/V, ambulating without difficulty. Breastfeeding   Objective: Vital signs in last 24 hours: Temp:  [97.5 F (36.4 C)-99.2 F (37.3 C)] 97.5 F (36.4 C) (09/04 0644) Pulse Rate:  [79-89] 81 (09/04 0644) Resp:  [18-20] 18 (09/04 0644) BP: (112-116)/(68-75) 112/74 mmHg (09/04 0644) SpO2:  [92 %-95 %] 95 % (09/03 2237)  Physical Exam:  General: alert, cooperative and appears stated age Lochia: appropriate Uterine Fundus: firm Incision: healing well DVT Evaluation: No evidence of DVT seen on physical exam.   Recent Labs  05/22/13 1420 05/23/13 0600  HGB 11.6* 10.4*  HCT 36.1 33.3*    Assessment/Plan: Status post Cesarean section. Doing well postoperatively.  Continue current care. Breastfeeding Anticipate D/C tomorrow  Dania Marsan H. 05/24/2013, 11:29 AM

## 2013-05-25 NOTE — Progress Notes (Signed)
Pt stated that it was still burning while she voided.  She asked if we could check her urine while she was still here so I stated I would call Dr Arlyce Dice.  I called Dr Arlyce Dice and he said ok lets do an in and out cath from [facility] that has the 10cc syringe because if we do a clean catch the vaginal discharge can alter the results. I informed the patient what Dr Arlyce Dice ordered and she said oh no Im not doing that I told her what he told me about the vaginal discharge and she stated that that made sense but she has had enough trauma to her body already.  I called Dr Arlyce Dice back and he stated ok and he will see her tomorrow morning. Winferd Humphrey, RN

## 2013-05-25 NOTE — Progress Notes (Signed)
Post Op Day 3  Subjective: Does not want to go home.  Concerned about pain control and nursing.  Objective: Blood pressure 116/75, pulse 75, temperature 97.6 F (36.4 C), temperature source Oral, resp. rate 18, height 5\' 8"  (1.727 m), weight 117.935 kg (260 lb), last menstrual period 08/23/2012, SpO2 95.00%,  breastfeeding.  Physical Exam:  General: alert Lochia: appropriate Uterine Fundus: firm Incision: healing well   Recent Labs  05/22/13 1420 05/23/13 0600  HGB 11.6* 10.4*  HCT 36.1 33.3*    Assessment/Plan: Will continue observation and reevaluate for discharge later today.   LOS: 3 days   Jerilyn Gillaspie D 05/25/2013, 9:18 AM

## 2013-05-26 ENCOUNTER — Encounter (HOSPITAL_COMMUNITY)
Admission: RE | Admit: 2013-05-26 | Discharge: 2013-05-26 | Disposition: A | Discharge status: Home / Self Care | Payer: BC Managed Care – PPO | Source: Ambulatory Visit | Attending: Obstetrics and Gynecology | Admitting: Obstetrics and Gynecology

## 2013-05-26 DIAGNOSIS — O923 Agalactia: Secondary | ICD-10-CM | POA: Insufficient documentation

## 2013-05-26 MED ORDER — IBUPROFEN 600 MG PO TABS: 600.0000 mg | ORAL_TABLET | Freq: Four times a day (QID) | ORAL | Status: DC

## 2013-05-26 MED ORDER — OXYCODONE-ACETAMINOPHEN 5-325 MG PO TABS: 1.0000 | ORAL_TABLET | ORAL | Status: DC | PRN

## 2013-05-26 NOTE — Discharge Summary (Signed)
Obstetric Discharge Summary Reason for Admission: onset of labor Prenatal Procedures: amniocentesis and ultrasound Intrapartum Procedures: cesarean: low cervical, transverse Postpartum Procedures: none Complications-Operative and Postpartum: none Hemoglobin  Date Value Range Status  05/23/2013 10.4* 12.0 - 15.0 g/dL Final     HCT  Date Value Range Status  05/23/2013 33.3* 36.0 - 46.0 % Final    Physical Exam:  General: alert Lochia: appropriate Uterine Fundus: firm Incision: healing well DVT Evaluation: No evidence of DVT seen on physical exam.  Discharge Diagnoses: Term Pregnancy-delivered, Abnormal fetal heart rate tracing.  Discharge Information: Date: 05/26/2013 Activity: Limited Diet: routine Medications: PNV, Ibuprofen and Percocet Condition: stable Instructions: refer to practice specific booklet Discharge to: home Follow-up Information   Follow up with HORVATH,MICHELLE A, MD. Schedule an appointment as soon as possible for a visit in 4 weeks.   Specialty:  Obstetrics and Gynecology   Contact information:   [address] [place] [address] [phone]       Newborn Data: Live born female  Birth Weight: 7 lb 10.6 oz (3476 g) APGAR: 8, 9  Home with mother.  Magdelene Ruark D 05/26/2013, 10:12 AM

## 2013-05-31 ENCOUNTER — Ambulatory Visit: Payer: Self-pay

## 2013-05-31 NOTE — Lactation Note (Signed)
This note was copied from the chart of [name]. Adult Lactation Consultation Outpatient Visit Note  Patient Name: Heather Hicks   BW 7-10 Date of Birth: 05/22/2013   DC weight 7-0 Gestational Age at Delivery: Unknown Yesterday 7-1 at [facility] Type of Delivery:   Breastfeeding History: Frequency of Breastfeeding: q 3 hours- wakes baby for feeding Length of Feeding: 30-40 min Voids: QS- had void while here Stools: QS  Supplementing / Method: Was bottle feeding EBM and formula but has not supplemented in 48 hours Pumping:  Type of Pump: Symphony    Frequency: No pumping in the last 24 hours  Volume:    Comments:    Consultation Evaluation: Mom has been using NS due to flat nipples. Mom reports that her milk came in [place]- Tuesday Reports that last night her nipples became very sore- not sure about latch.  Initial Feeding Assessment: Attempted to latch Ella without the nipple shield but she was too fussy. I used #20 NS ( Mom has been using 24) Seems to be better fit now. Ella latched and nursed for 20 minutes- Did need some stimulation to keep nursing. Mom reports that feels much better. Used to football hold instead of cross cradle mom has been using. Encouraged to change positions to promote healing. Reviewed wide open mouth and keeping the baby close to the breast throughout the feeding. Lots of whitish milk noted in NS and mom reports that breast feel softer after nursing Pre-feed Weight: 7- 1.3  Without diaper  3214g Post-feed Weight: 7-2.3   Without diaper 3240g Amount Transferred: 26 cc's Comments:  Additional Feeding Assessment:Latced Ell in football hold with NS. She nursed to 15 minutes then off to sleep. Again milk noted in NS and mom reports that breast feels softer after Samson Frederic came off the breast. Pre-feed Weight: 7- 2.3  Without diaper  3240g Post-feed Weight: 7- 3.1 without diaper  3262g Amount Transferred:22 cc's Comments:   Total Breast milk Transferred this Visit: 48  cc's Total Supplement Given: 0  Additional Interventions: Reviewed basic teaching with mom and grandmother. Encouraged mom to feed every 2-3 hours- to get 10- 12 feedings/ 24 hours instead of 8 she is now. Comfort gels given to replace the first set.    Follow-Up With Ped and [facility]. To call here prn with questions/ concerns     Pamelia Hoit 05/31/2013, 11:39 AM

## 2013-07-27 IMAGING — US US RENAL
1 series · 14 of 25 positions shown · non-contrast
Comparison: 08/04/2007

CLINICAL DATA: Left lower quadrant pain, pregnant.

RENAL/URINARY TRACT ULTRASOUND COMPLETE

[Series 1: us renal · 14 of 37 slices shown]
[im 1/37]
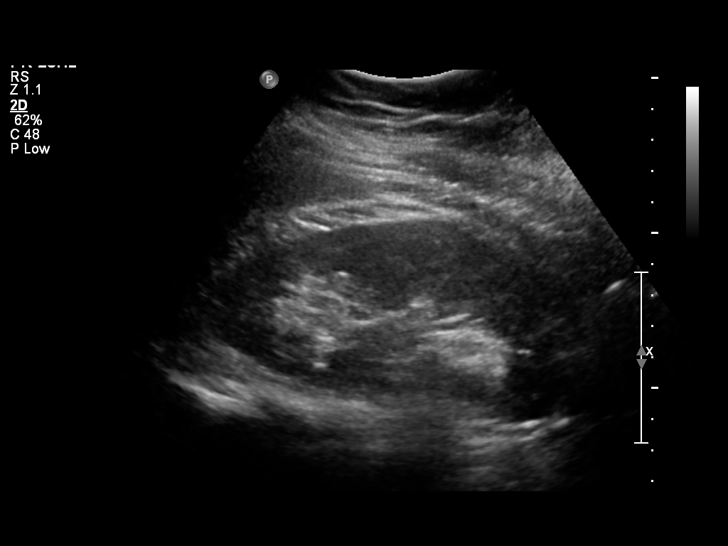
[im 4/37]
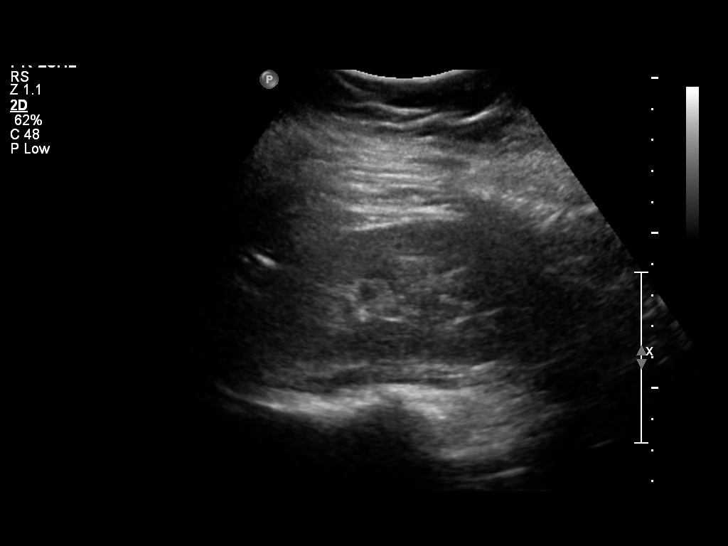
[im 7/37]
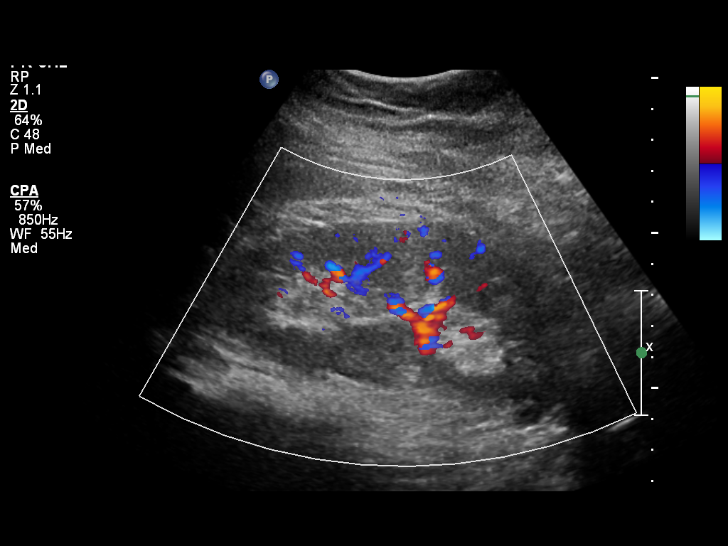
[im 10/37]
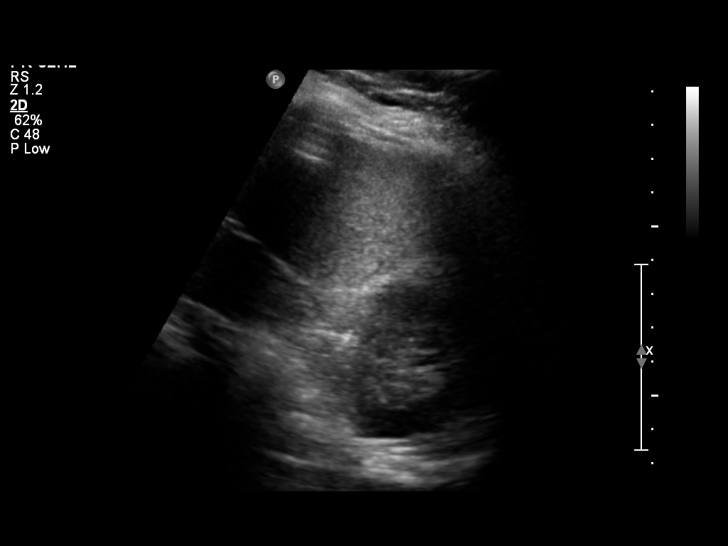
[im 13/37]
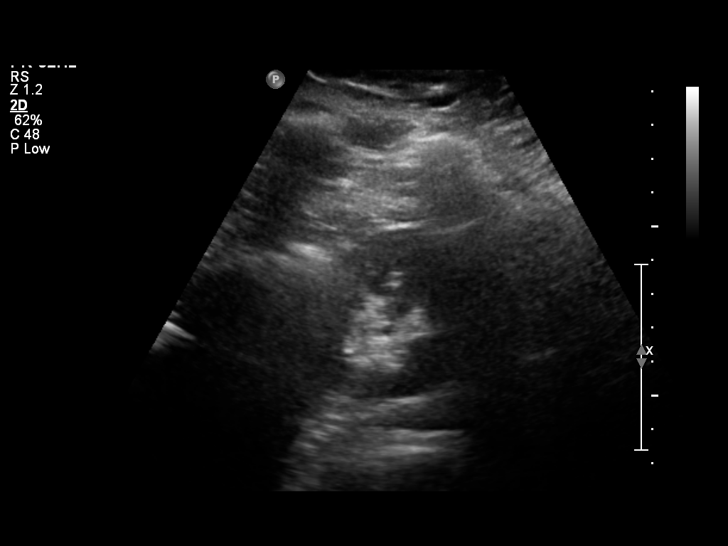
[im 14/37]
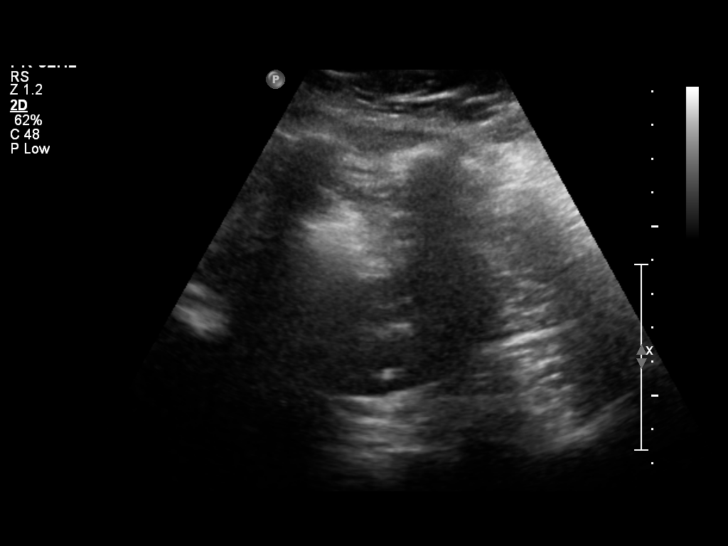
[im 17/37]
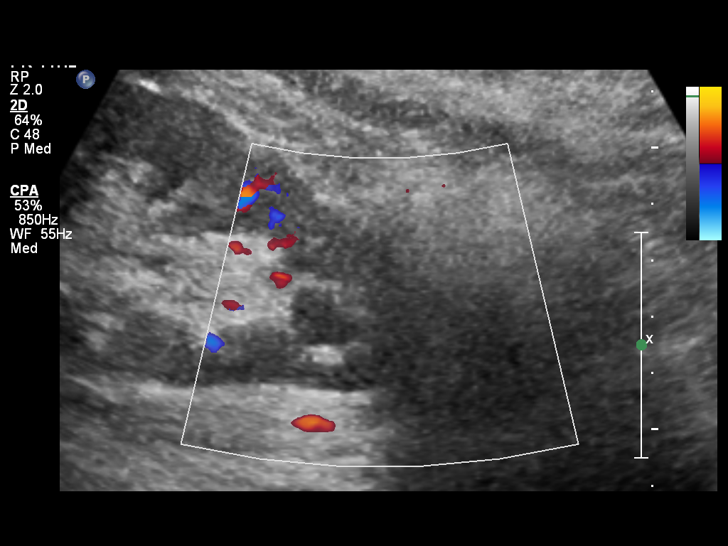
[im 20/37]
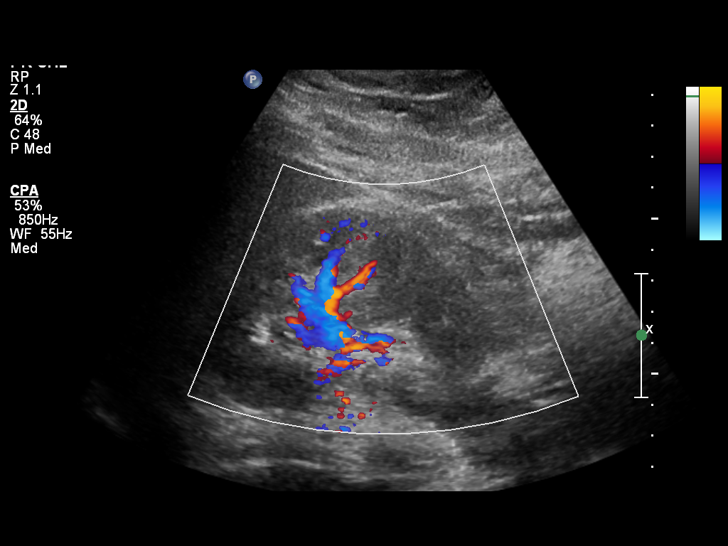
[im 23/37]
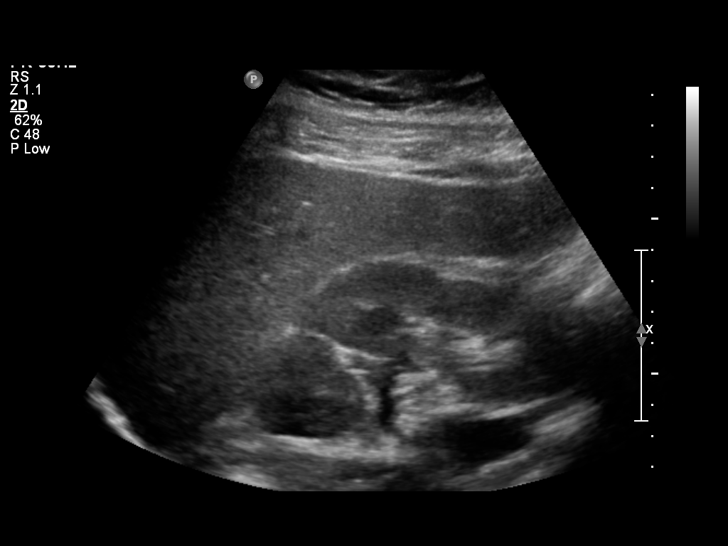
[im 25/37]
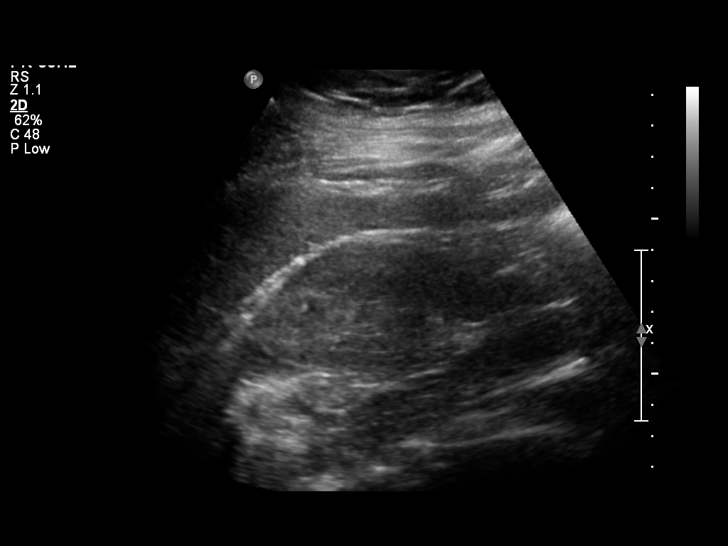
[im 28/37]
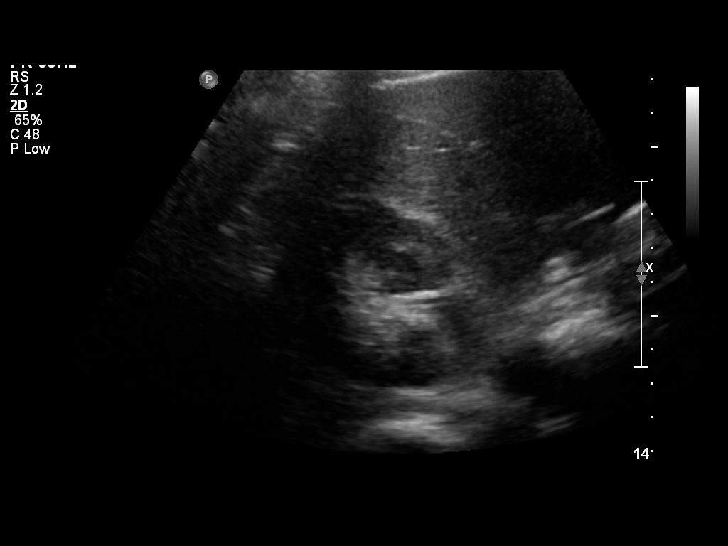
[im 31/37]
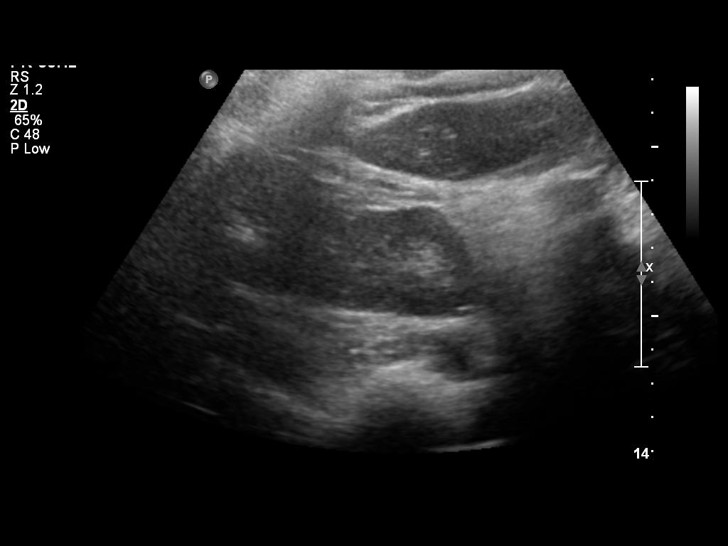
[im 34/37]
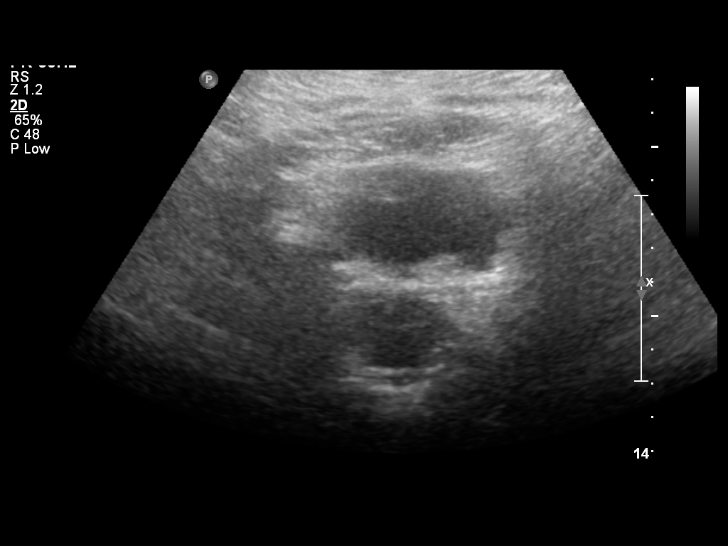
[im 37/37]
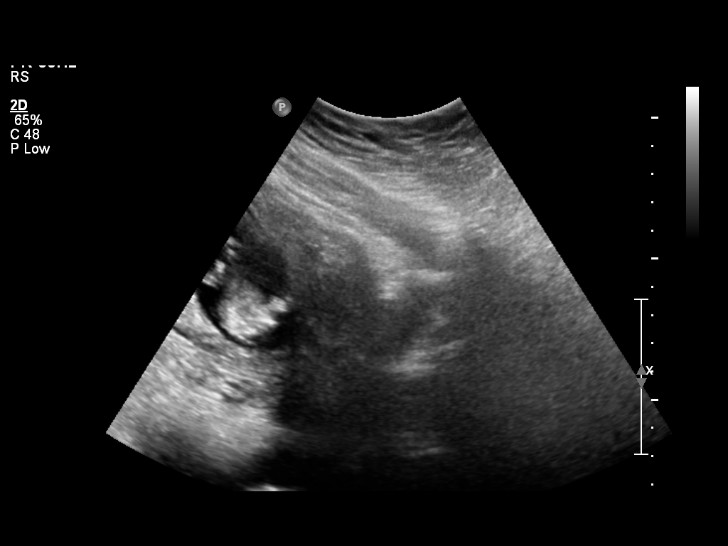

[14 of 25 positions shown; findings below may reference images not displayed]

FINDINGS: Right Kidney:  Measures 11.7 cm.  No hydronephrosis.  No focal
abnormality.

Left Kidney:  Measures 11.9 cm.  No hydronephrosis.  There is a
x 1.0 x 0.8 cm hypoechoic/nearly anechoic lesion arising the lower
pole, favored to represent a cyst however not definitively
characterized on this examination.

Bladder:  Poorly visualized as the patient had with prior to the
examination.
IMPRESSION: No hydronephrosis or shadowing calculi identified.

Hypoechoic/ nearly anechoic focus arising the lower pole of the
left kidney measuring 1.1 cm is favored to represent a cyst however
incompletely characterized on this examination.  Consider a 6-month
ultrasound follow-up.

## 2014-05-31 IMAGING — US US OB DETAIL+14 WK
1 series · 12 of 28 positions shown · non-contrast
Comparison: none

[Series 1: us ob detail+14 wk · 0.21mm/px · 12 of 101 slices shown]
[im 4/101]
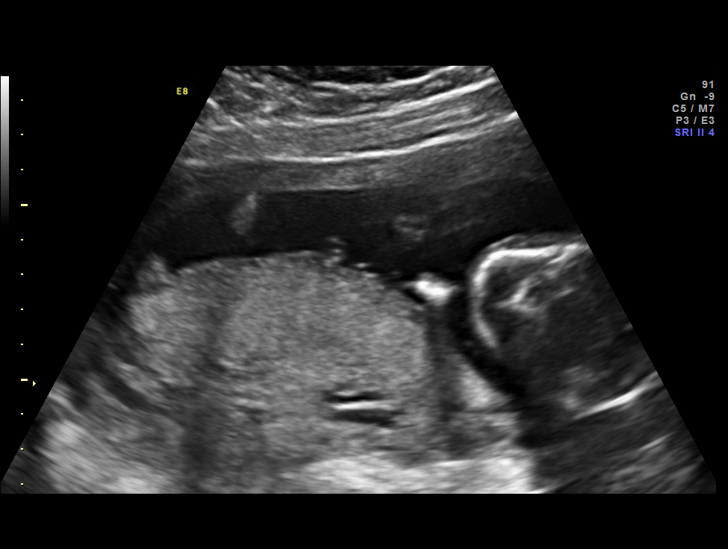
[im 12/101]
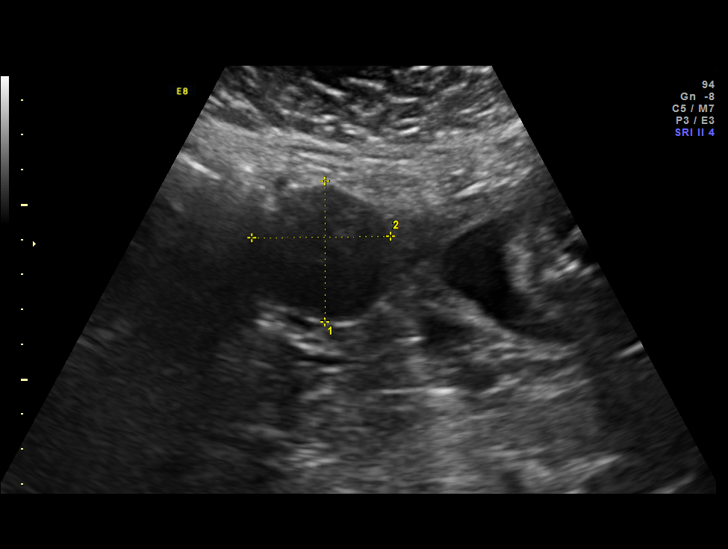
[im 19/101]
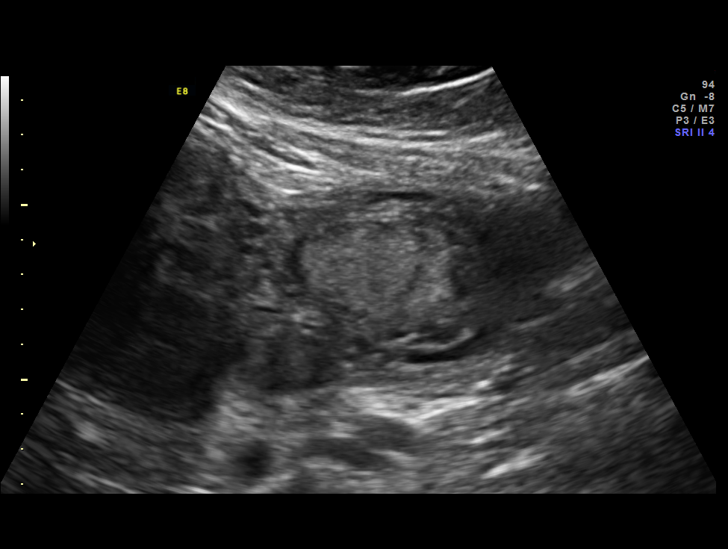
[im 30/101]
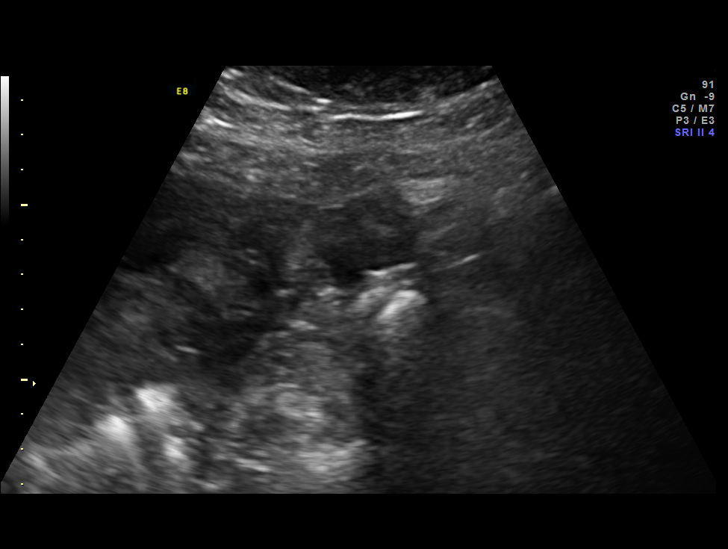
[im 38/101]
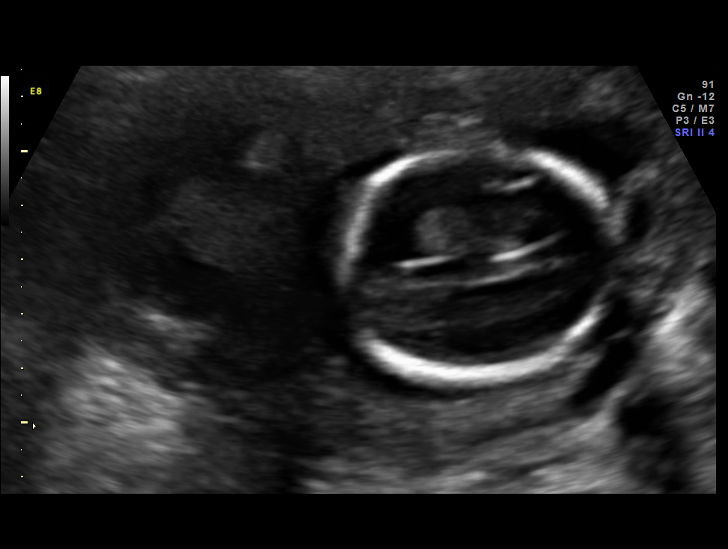
[im 45/101]
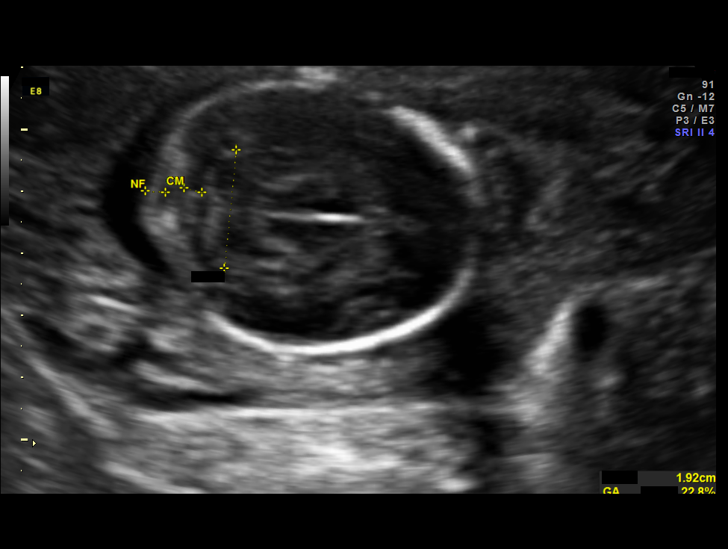
[im 56/101]
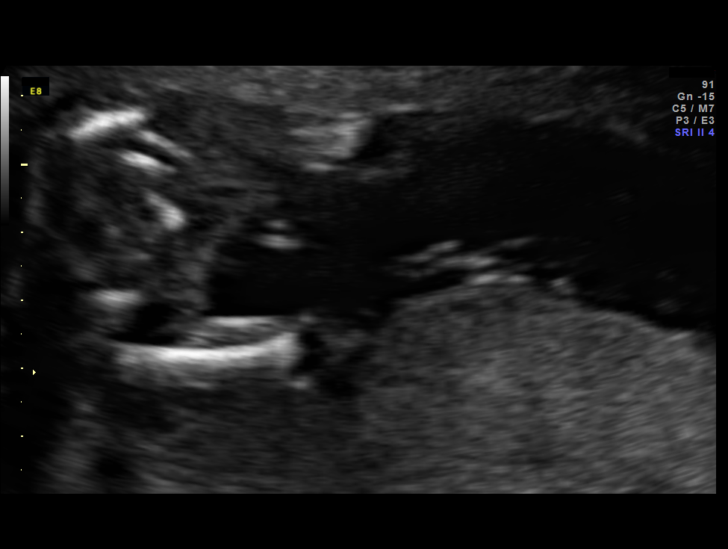
[im 63/101]
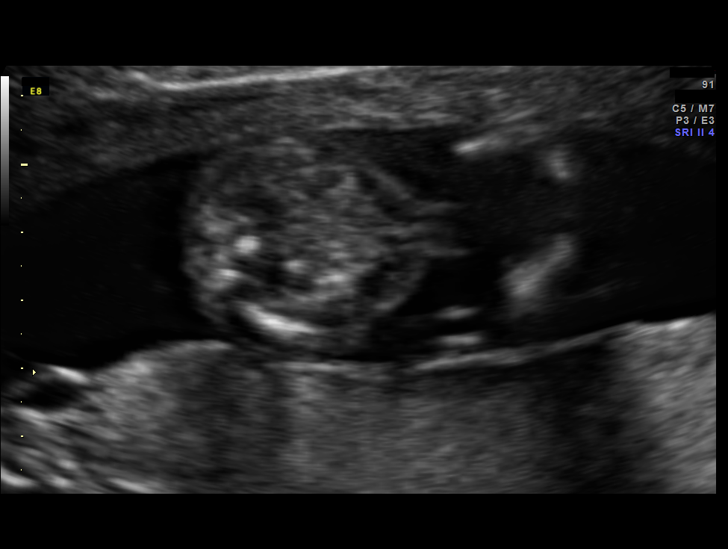
[im 71/101]
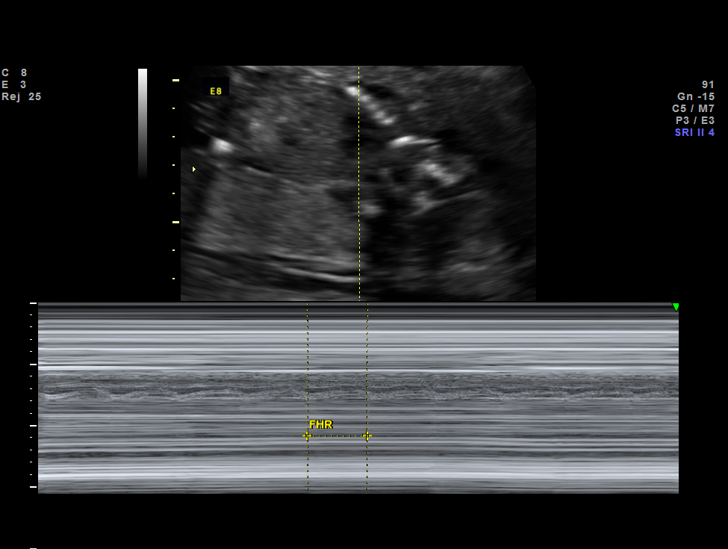
[im 82/101]
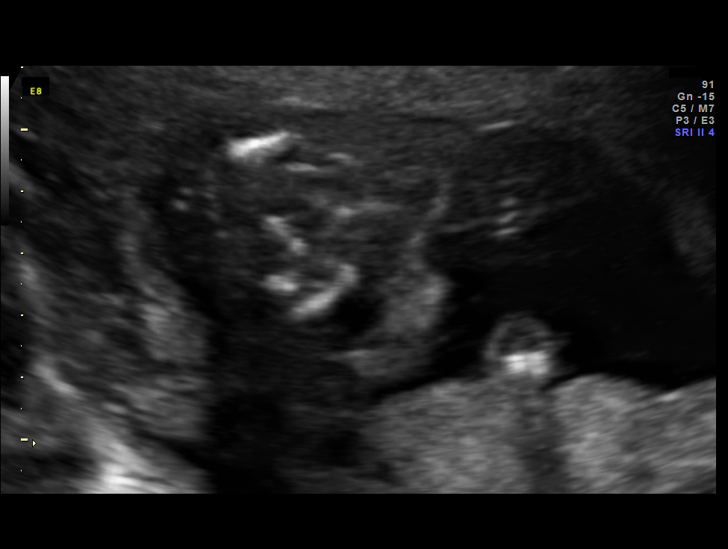
[im 89/101]
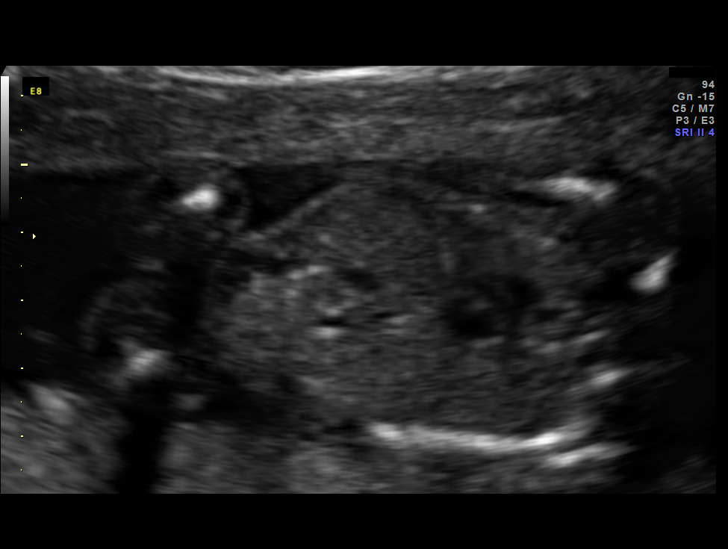
[im 97/101]
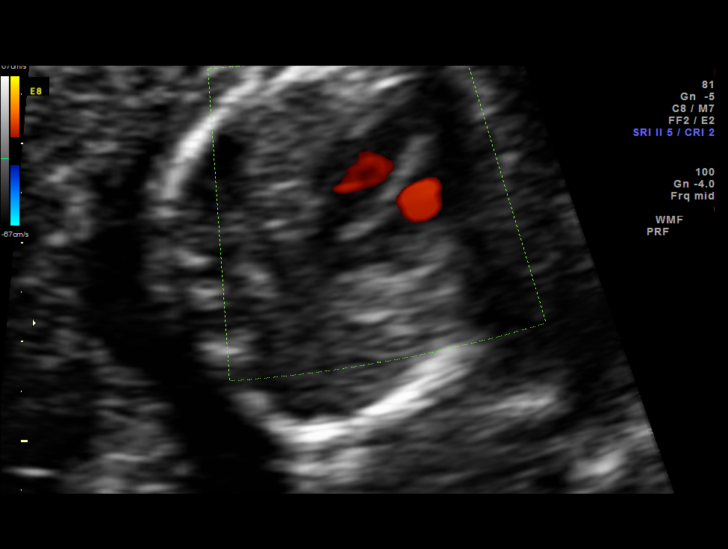

[12 of 28 positions shown; findings below may reference images not displayed]

OBSTETRICS REPORT
                      (Signed Final 01/04/2013 [DATE])

Service(s) Provided

 US OB DETAIL + 14 WK                                  76811.0
Indications

 Detailed fetal anatomic survey
 Advanced maternal age (AMA), Multigravida
 History of genetic / anatomic abnormality -
 personal or family
Fetal Evaluation

 Num Of Fetuses:    1
 Fetal Heart Rate:  167                          bpm
 Cardiac Activity:  Observed
 Presentation:      Cephalic
 Placenta:          Posterior, above cervical
                    os
 P. Cord            Visualized
 Insertion:

 Amniotic Fluid
 AFI FV:      Subjectively within normal limits
                                             Larg Pckt:     3.7  cm
Biometry

 BPD:     43.4  mm     G. Age:  19w 1d                CI:         77.1   70 - 86
 OFD:     56.3  mm                                    FL/HC:      18.2   16.1 -

 HC:     160.1  mm     G. Age:  18w 6d       56  %    HC/AC:      1.15   1.09 -

 AC:     139.4  mm     G. Age:  19w 2d       72  %    FL/BPD:
 FL:      29.1  mm     G. Age:  19w 0d       58  %    FL/AC:      20.9   20 - 24
 HUM:     28.4  mm     G. Age:  19w 1d       69  %
 CER:     19.2  mm     G. Age:  18w 4d       51  %
 NFT:      3.3  mm

 Est. FW:     275  gm    0 lb 10 oz      55  %
Gestational Age

 LMP:           19w 1d        Date:  08/23/12                 EDD:   05/30/13
 U/S Today:     19w 1d                                        EDD:   05/30/13
 Best:          18w 4d     Det. By:  Early Ultrasound         EDD:   06/03/13
                                     (10/13/12)
Anatomy

 Cranium:          Appears normal         Diaphragm:        Appears normal
 Fetal Cavum:      Appears normal         Stomach:          Appears normal, left
                                                            sided
 Ventricles:       Appears normal         Abdomen:          Appears normal
 Choroid Plexus:   Appears normal         Abdominal Wall:   Appears nml (cord
                                                            insert, abd wall)
 Cerebellum:       Appears normal         Cord Vessels:     Appears normal (3
                                                            vessel cord)
 Posterior Fossa:  Appears normal         Kidneys:          Appear normal
 Nuchal Fold:      Appears normal         Bladder:          Appears normal
 Face:             Appears normal         Spine:            Appears normal
                   (orbits and profile)
 Lips:             Appears normal         Lower             Appears normal
                                          Extremities:
 Heart:            Appears normal         Upper             Appears normal
                   (4CH, axis, and        Extremities:
                   situs)
 LVOT:             Appears normal

 Other:  Fetus appears to be a female. Heels and 5th digit appear normal.
Targeted Anatomy

 Fetal Central Nervous System
 Cisterna Magna:
Cervix Uterus Adnexa

 Cervical Length:    3.8      cm

 Cervix:       Normal appearance by transabdominal scan. Appears
               closed, without funnelling.

 Left Ovary:    Within normal limits.
 Right Ovary:   Within normal limits.
Myomas

 Site                     L(cm)      W(cm)       D(cm)      Location
 Fundus
 Fundus Right             4          4
 LUS
 Posterior
 Anterior                 2
 Anterior

 Blood Flow                  RI       PI       Comments

Comments

 The patient's fetal anatomic survey is not complete due to
 fetal position.  Cardiac anatomy could not be adequately
 visualized.  However, no gross fetal anomalies were
 identified.  A follow-up ultrasound will be performed in 4
 weeks to reassess fetal growth and anatomy.
Impression

 Single living intrauterine pregnancy at 18 weeks 4 days.
 Appropriate fetal growth (55%).
 Normal amniotic fluid volume.
 No gross fetal anomalies identified.
Recommendations

 Recommend follow-up ultrasound examination in 4 weeks.

                [place], Gendrik

## 2014-07-22 ENCOUNTER — Encounter (HOSPITAL_COMMUNITY): Payer: Self-pay | Admitting: Obstetrics and Gynecology

## 2014-08-28 IMAGING — US US OB FOLLOW-UP
1 series · 16 of 28 positions shown · non-contrast
Comparison: none

[Series 1: us ob follow-up · 0.26mm/px · 16 of 70 slices shown]
[im 1/70]
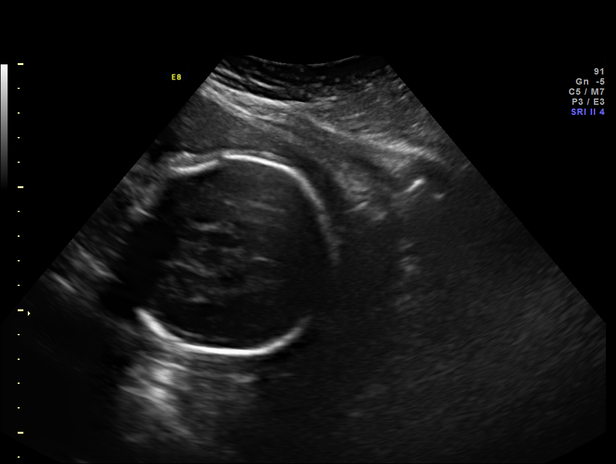
[im 6/70]
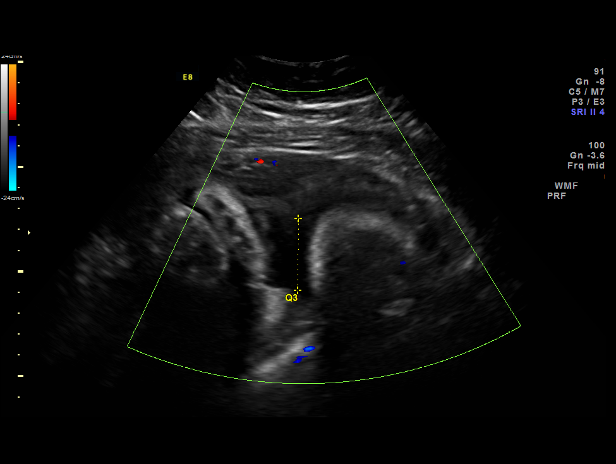
[im 11/70]
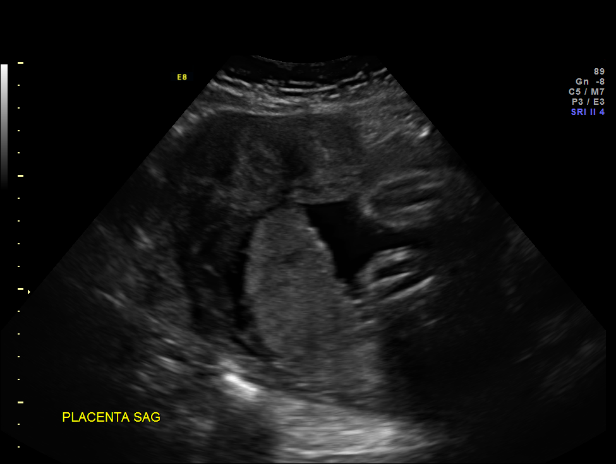
[im 16/70]
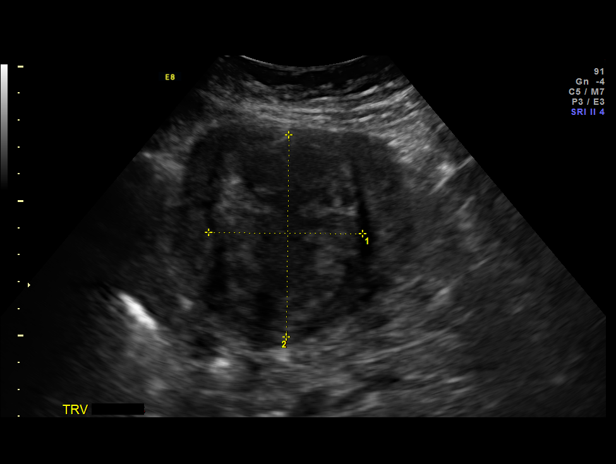
[im 18/70]
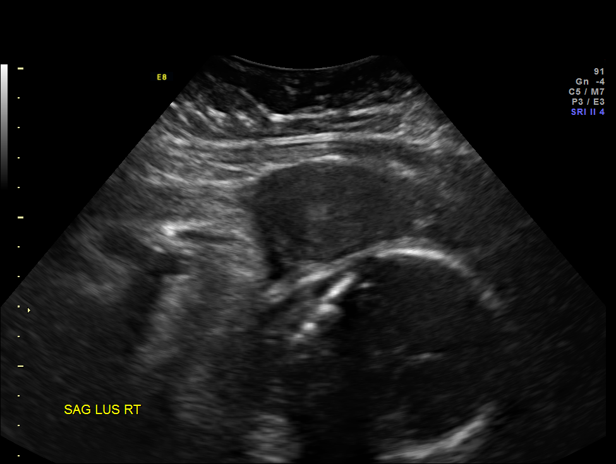
[im 24/70]
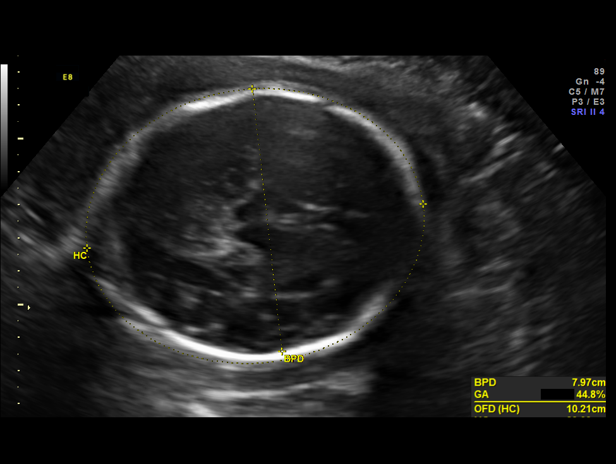
[im 29/70]
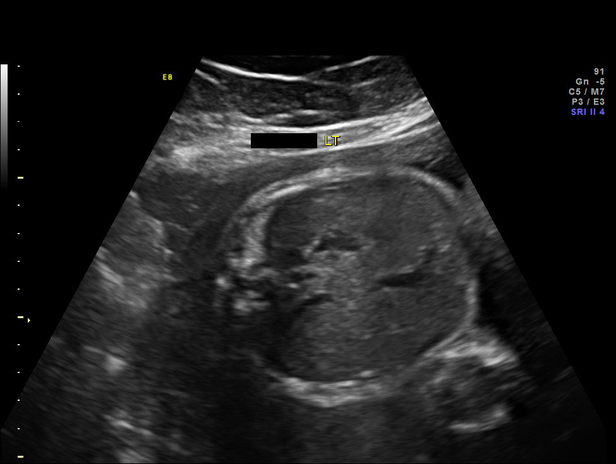
[im 34/70]
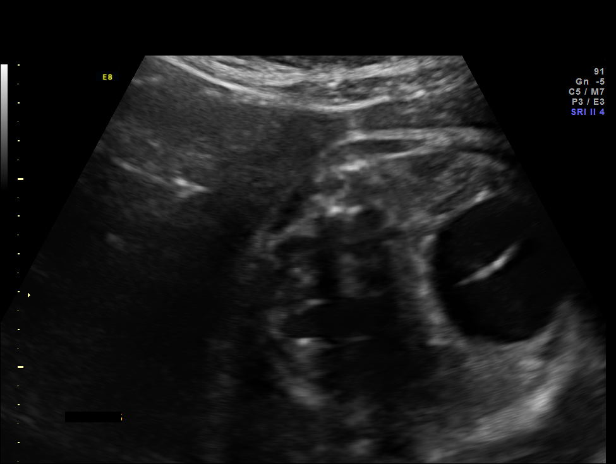
[im 36/70]
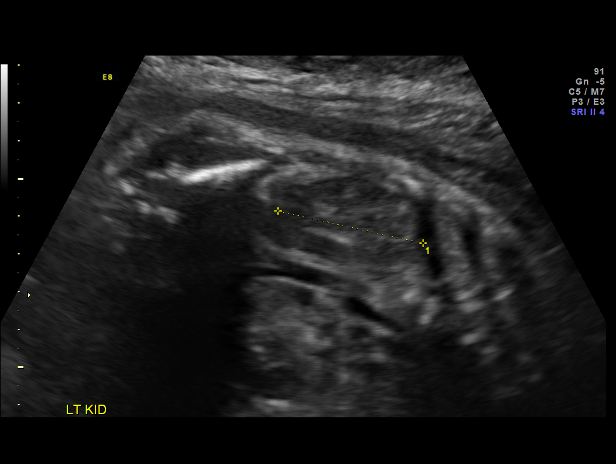
[im 41/70]
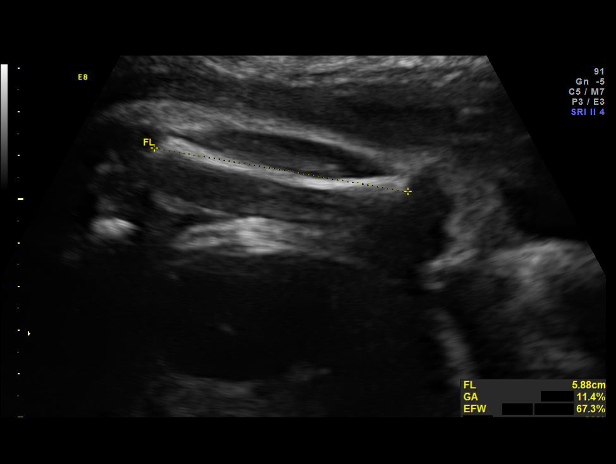
[im 47/70]
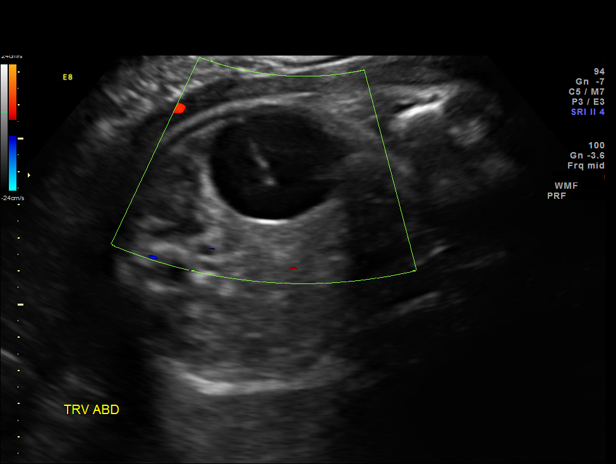
[im 52/70]
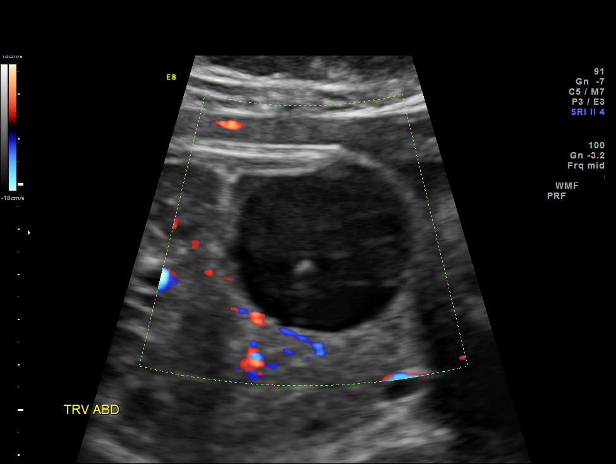
[im 54/70]
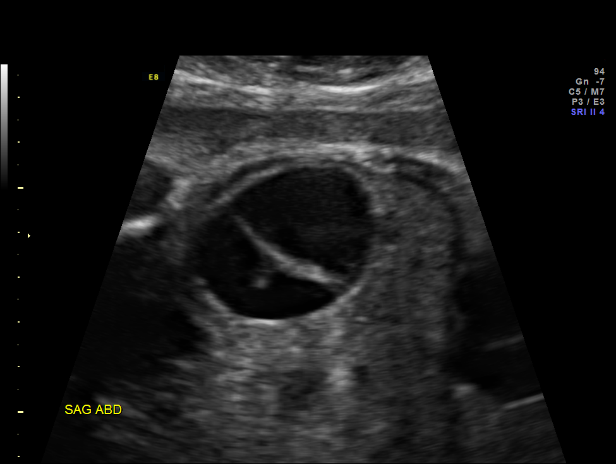
[im 59/70]
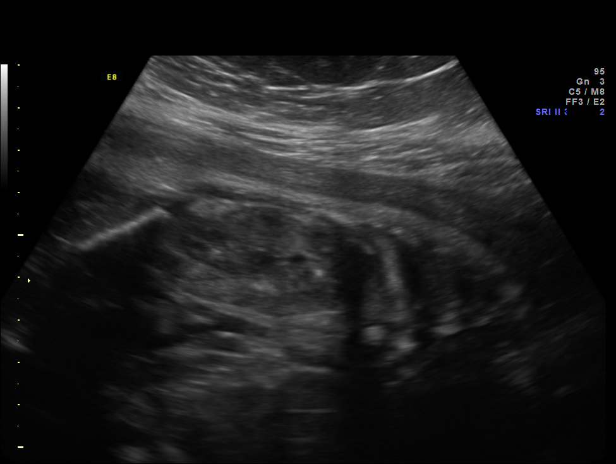
[im 64/70]
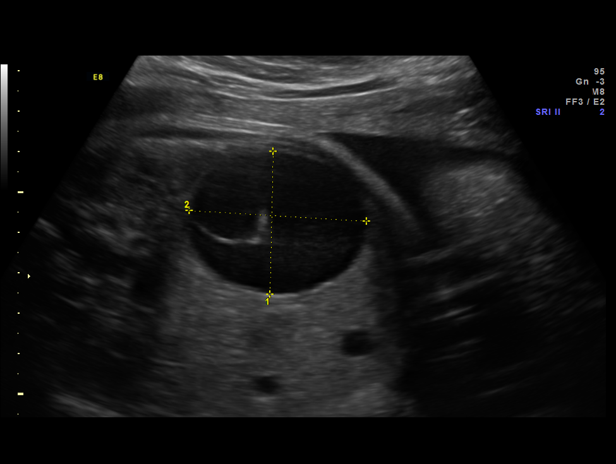
[im 70/70]
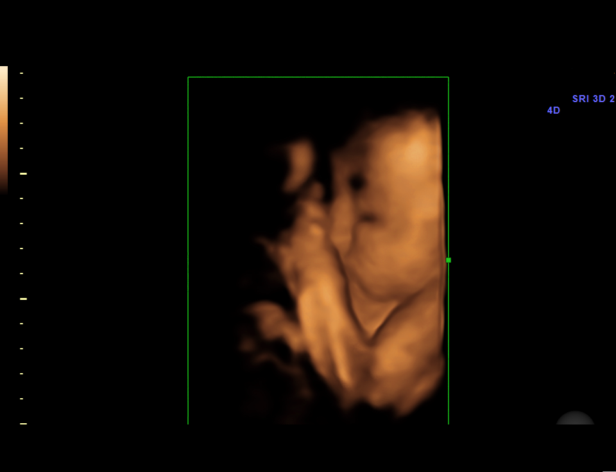

[16 of 28 positions shown; findings below may reference images not displayed]

OBSTETRICS REPORT
                      (Signed Final 04/03/2013 [DATE])

Service(s) Provided

 US OB FOLLOW UP                                       76816.1
Indications

 Fetal abnormality - other known or suspected
 (cystic abdominal mass)
 Advanced maternal age (AMA), Multigravida
 (normal amnio)
 History of genetic abnormality - prev pregnancy
 with T21
 Uterine fibroids
Fetal Evaluation

 Num Of Fetuses:    1
 Fetal Heart Rate:  132                          bpm
 Cardiac Activity:  Observed
 Presentation:      Cephalic
 Placenta:          Posterior, above cervical
                    os
 P. Cord            Previously Visualized
 Insertion:

 Amniotic Fluid
 AFI FV:      Subjectively within normal limits
 AFI Sum:     10.16   cm       17  %Tile     Larg Pckt:    3.97  cm
 RUQ:   2.76    cm   RLQ:    0      cm    LUQ:   3.97    cm   LLQ:    3.43   cm
Biometry

 BPD:     79.6  mm     G. Age:  32w 0d                CI:         77.1   70 - 86
 OFD:    103.3  mm                                    FL/HC:      20.2   19.1 -

 HC:     292.2  mm     G. Age:  32w 2d       23  %    HC/AC:      0.97   0.96 -

 AC:     299.9  mm     G. Age:  34w 0d       94  %    FL/BPD:     74.1   71 - 87
 FL:        59  mm     G. Age:  30w 6d       13  %    FL/AC:      19.7   20 - 24
 HUM:       54  mm     G. Age:  31w 3d       42  %
 CER:     39.5  mm     G. Age:  33w 4d       74  %

 Est. FW:    1878  gm      4 lb 8 oz     72  %
Gestational Age
 LMP:           31w 6d        Date:  08/23/12                 EDD:   05/30/13
 U/S Today:     32w 2d                                        EDD:   05/27/13
 Best:          31w 6d     Det. By:  LMP  (08/23/12)          EDD:   05/30/13
Anatomy

 Cranium:          Appears normal         Aortic Arch:      Previously seen
 Fetal Cavum:      Appears normal         Ductal Arch:      Not well visualized
 Ventricles:       Appears normal         Diaphragm:        Previously seen
 Choroid Plexus:   Previously seen        Stomach:          Appears normal, left
                                                            sided
 Cerebellum:       Appears normal         Abdomen:          Abnormal, see
                                                            comments
 Posterior Fossa:  Previously seen        Abdominal Wall:   Previously seen
 Nuchal Fold:      Previously seen        Cord Vessels:     Previously seen
 Face:             Orbits and profile     Kidneys:          Appear normal
                   previously seen
 Lips:             Previously seen        Bladder:          Appears normal
 Heart:            Appears normal         Spine:            Previously seen
                   (4CH, axis, and
                   situs)
 RVOT:             Previously seen        Lower             Previously seen
                                          Extremities:
 LVOT:             Previously seen        Upper             Previously seen
                                          Extremities:

 Other:  Fetus appears to be a female. Heels and 5th digit previously seen.
         Nasal bone previously visualized.
Cervix Uterus Adnexa

 Cervix:       Not visualized (advanced GA >67wks)

 Adnexa:     No abnormality visualized.
Myomas

 Site                     L(cm)      W(cm)       D(cm)      Location
 Fundus
 Fundus Right
 LUS right
 Posterior
 Anterior left            4.3        3.6         2
 Anterior

 Blood Flow                  RI       PI       Comments

                                               Previously seen
                                               Previously seen
                                               Previously seen
                                               Previously seen
Impression

 IUP at 31+6 weeks
 Left-sided abdominal/pelvic round cystic structure (4.0 x 3.3 x
 4.0 cms) with a few striations; very little blood flow
 Normal interval anatomy; anatomic survey complete
 Normal amniotic fluid volume
 Appropriate interval growth with EFW at the 72nd %tile
 Fibroid uterus: see above for size and location

 The US findings were shared with Ms. Devotchka.  The
 implications of an abdominal cystic mass were discussed in
 detail.  Findings are most c/w an ovarian cyst (thin-walled,
 female and most common abdominal cyst in female fetuses).
 Other possiblities would be of GI origin. No sign of GI
 obstruction. The kidneys appeared to be normal and not
 connected to the cyst.
Recommendations

 Follow cyst for enlargement and signs of torsion
 Postnatal imaging to confirm diagnosis
 Consultation with a pediatric surgeon
 Routine prenatal care and delivery if cyst remains small
 enough for a vaginal delivery

## 2014-09-12 ENCOUNTER — Encounter (HOSPITAL_COMMUNITY): Payer: Self-pay | Admitting: Emergency Medicine

## 2014-09-12 ENCOUNTER — Ambulatory Visit (HOSPITAL_COMMUNITY): Payer: BC Managed Care – PPO | Admitting: Anesthesiology

## 2014-09-12 ENCOUNTER — Ambulatory Visit (HOSPITAL_COMMUNITY)
Admission: RE | Admit: 2014-09-12 | Discharge: 2014-09-12 | Disposition: A | Discharge status: Home / Self Care | Payer: BC Managed Care – PPO | Source: Ambulatory Visit | Attending: Obstetrics and Gynecology | Admitting: Obstetrics and Gynecology

## 2014-09-12 ENCOUNTER — Encounter (HOSPITAL_COMMUNITY)
Admission: RE | Disposition: A | Discharge status: Home / Self Care | Payer: Self-pay | Source: Ambulatory Visit | Attending: Obstetrics and Gynecology

## 2014-09-12 DIAGNOSIS — D259 Leiomyoma of uterus, unspecified: Secondary | ICD-10-CM | POA: Insufficient documentation

## 2014-09-12 DIAGNOSIS — Z91013 Allergy to seafood: Secondary | ICD-10-CM | POA: Insufficient documentation

## 2014-09-12 DIAGNOSIS — G43909 Migraine, unspecified, not intractable, without status migrainosus: Secondary | ICD-10-CM | POA: Diagnosis not present

## 2014-09-12 DIAGNOSIS — Z85820 Personal history of malignant melanoma of skin: Secondary | ICD-10-CM | POA: Diagnosis not present

## 2014-09-12 DIAGNOSIS — O021 Missed abortion: Secondary | ICD-10-CM | POA: Diagnosis not present

## 2014-09-12 DIAGNOSIS — K219 Gastro-esophageal reflux disease without esophagitis: Secondary | ICD-10-CM | POA: Diagnosis not present

## 2014-09-12 DIAGNOSIS — Z880 Allergy status to penicillin: Secondary | ICD-10-CM | POA: Diagnosis not present

## 2014-09-12 HISTORY — PX: DILATION AND CURETTAGE OF UTERUS: SHX78

## 2014-09-12 HISTORY — PX: DILATION AND EVACUATION: SHX1459

## 2014-09-12 LAB — CBC
HEMATOCRIT: 40 % (ref 36.0–46.0)
Hemoglobin: 12.9 g/dL (ref 12.0–15.0)
MCH: 26.4 pg (ref 26.0–34.0)
MCHC: 32.3 g/dL (ref 30.0–36.0)
MCV: 82 fL (ref 78.0–100.0)
Platelets: 328 10*3/uL (ref 150–400)
RBC: 4.88 MIL/uL (ref 3.87–5.11)
RDW: 13.1 % (ref 11.5–15.5)
WBC: 9.1 10*3/uL (ref 4.0–10.5)

## 2014-09-12 SURGERY — DILATION AND CURETTAGE
Anesthesia: General | Site: Uterus

## 2014-09-12 MED ORDER — SILVER NITRATE-POT NITRATE 75-25 % EX MISC
CUTANEOUS | Status: DC | PRN
  Administered 2014-09-12: 2

## 2014-09-12 MED ORDER — PROPOFOL 10 MG/ML IV BOLUS
INTRAVENOUS | Status: DC | PRN
  Administered 2014-09-12: 170 mg via INTRAVENOUS

## 2014-09-12 MED ORDER — FENTANYL CITRATE 0.05 MG/ML IJ SOLN: 25.0000 ug | INTRAMUSCULAR | Status: DC | PRN

## 2014-09-12 MED ORDER — ONDANSETRON HCL 4 MG/2ML IJ SOLN
INTRAMUSCULAR | Status: AC
  Filled 2014-09-12: qty 2

## 2014-09-12 MED ORDER — SCOPOLAMINE 1 MG/3DAYS TD PT72
MEDICATED_PATCH | TRANSDERMAL | Status: AC
  Filled 2014-09-12: qty 1

## 2014-09-12 MED ORDER — KETOROLAC TROMETHAMINE 30 MG/ML IJ SOLN
INTRAMUSCULAR | Status: DC | PRN
  Administered 2014-09-12: 30 mg via INTRAVENOUS

## 2014-09-12 MED ORDER — FENTANYL CITRATE 0.05 MG/ML IJ SOLN
INTRAMUSCULAR | Status: DC | PRN
  Administered 2014-09-12 (×2): 25 ug via INTRAVENOUS
  Administered 2014-09-12: 50 ug via INTRAVENOUS

## 2014-09-12 MED ORDER — LIDOCAINE HCL (CARDIAC) 20 MG/ML IV SOLN
INTRAVENOUS | Status: AC
  Filled 2014-09-12: qty 5

## 2014-09-12 MED ORDER — ONDANSETRON HCL 4 MG/2ML IJ SOLN
INTRAMUSCULAR | Status: DC | PRN
  Administered 2014-09-12: 4 mg via INTRAVENOUS

## 2014-09-12 MED ORDER — PROPOFOL 10 MG/ML IV EMUL
INTRAVENOUS | Status: AC
  Filled 2014-09-12: qty 20

## 2014-09-12 MED ORDER — SCOPOLAMINE 1 MG/3DAYS TD PT72
1.0000 | MEDICATED_PATCH | Freq: Once | TRANSDERMAL | Status: DC
  Administered 2014-09-12: 1.5 mg via TRANSDERMAL

## 2014-09-12 MED ORDER — LACTATED RINGERS IV SOLN
INTRAVENOUS | Status: DC
  Administered 2014-09-12 (×2): via INTRAVENOUS

## 2014-09-12 MED ORDER — MIDAZOLAM HCL 2 MG/2ML IJ SOLN
INTRAMUSCULAR | Status: AC
  Filled 2014-09-12: qty 2

## 2014-09-12 MED ORDER — OXYCODONE-ACETAMINOPHEN 5-325 MG PO TABS: 2.0000 | ORAL_TABLET | ORAL | Status: DC | PRN

## 2014-09-12 MED ORDER — DEXAMETHASONE SODIUM PHOSPHATE 4 MG/ML IJ SOLN
INTRAMUSCULAR | Status: AC
  Filled 2014-09-12: qty 1

## 2014-09-12 MED ORDER — KETOROLAC TROMETHAMINE 30 MG/ML IJ SOLN
INTRAMUSCULAR | Status: AC
  Filled 2014-09-12: qty 1

## 2014-09-12 MED ORDER — FENTANYL CITRATE 0.05 MG/ML IJ SOLN
INTRAMUSCULAR | Status: AC
  Filled 2014-09-12: qty 2

## 2014-09-12 MED ORDER — DEXAMETHASONE SODIUM PHOSPHATE 10 MG/ML IJ SOLN
INTRAMUSCULAR | Status: DC | PRN
  Administered 2014-09-12: 4 mg via INTRAVENOUS

## 2014-09-12 MED ORDER — ONDANSETRON HCL 4 MG/2ML IJ SOLN: 4.0000 mg | Freq: Once | INTRAMUSCULAR | Status: DC | PRN

## 2014-09-12 MED ORDER — MIDAZOLAM HCL 2 MG/2ML IJ SOLN
INTRAMUSCULAR | Status: DC | PRN
  Administered 2014-09-12: 2 mg via INTRAVENOUS

## 2014-09-12 MED ORDER — LIDOCAINE HCL (CARDIAC) 20 MG/ML IV SOLN
INTRAVENOUS | Status: DC | PRN
  Administered 2014-09-12: 60 mg via INTRAVENOUS

## 2014-09-12 MED ORDER — IBUPROFEN 600 MG PO TABS: 600.0000 mg | ORAL_TABLET | Freq: Four times a day (QID) | ORAL | Status: DC | PRN

## 2014-09-12 MED ORDER — LIDOCAINE HCL 1 % IJ SOLN
INTRAMUSCULAR | Status: AC
  Filled 2014-09-12: qty 20

## 2014-09-12 MED ORDER — LIDOCAINE HCL 1 % IJ SOLN
INTRAMUSCULAR | Status: DC | PRN
  Administered 2014-09-12: 10 mL

## 2014-09-12 SURGICAL SUPPLY — 15 items
CATH ROBINSON RED A/P 16FR (CATHETERS) ×2 IMPLANT
CLOTH BEACON ORANGE TIMEOUT ST (SAFETY) ×2 IMPLANT
DILATOR CANAL MILEX (MISCELLANEOUS) IMPLANT
GLOVE BIO SURGEON STRL SZ7 (GLOVE) ×2 IMPLANT
GLOVE BIOGEL PI IND STRL 6.5 (GLOVE) IMPLANT
GLOVE BIOGEL PI IND STRL 7.0 (GLOVE) IMPLANT
GLOVE BIOGEL PI INDICATOR 6.5 (GLOVE) ×1
GLOVE BIOGEL PI INDICATOR 7.0 (GLOVE) ×1
GOWN STRL REUS W/TWL LRG LVL3 (GOWN DISPOSABLE) ×4 IMPLANT
PACK VAGINAL MINOR WOMEN LF (CUSTOM PROCEDURE TRAY) ×2 IMPLANT
PAD OB MATERNITY 4.3X12.25 (PERSONAL CARE ITEMS) ×2 IMPLANT
PAD PREP 24X48 CUFFED NSTRL (MISCELLANEOUS) ×2 IMPLANT
SET BERKELEY SUCTION TUBING (SUCTIONS) ×1 IMPLANT
TOWEL OR 17X24 6PK STRL BLUE (TOWEL DISPOSABLE) ×4 IMPLANT
VACURETTE 7MM CVD STRL WRAP (CANNULA) ×1 IMPLANT

## 2014-09-12 NOTE — Anesthesia Preprocedure Evaluation (Addendum)
Anesthesia Evaluation  Patient identified by MRN, date of birth, ID band Patient awake    Reviewed: Allergy & Precautions, H&P , NPO status , Patient's Chart, lab work & pertinent test results  History of Anesthesia Complications Negative for: history of anesthetic complications  Airway Mallampati: II  TM Distance: >3 FB Neck ROM: Full    Dental no notable dental hx. (+) Dental Advisory Given   Pulmonary neg pulmonary ROS,  breath sounds clear to auscultation  Pulmonary exam normal       Cardiovascular Exercise Tolerance: Good negative cardio ROS  Rhythm:Regular Rate:Normal     Neuro/Psych  Headaches, negative psych ROS   GI/Hepatic Neg liver ROS, GERD-  Medicated and Controlled,  Endo/Other  obesity  Renal/GU negative Renal ROS  negative genitourinary   Musculoskeletal negative musculoskeletal ROS (+)   Abdominal   Peds negative pediatric ROS (+)  Hematology negative hematology ROS (+)   Anesthesia Other Findings   Reproductive/Obstetrics negative OB ROS                            Anesthesia Physical Anesthesia Plan  ASA: II  Anesthesia Plan: General   Post-op Pain Management:    Induction: Intravenous  Airway Management Planned: LMA  Additional Equipment:   Intra-op Plan:   Post-operative Plan: Extubation in OR  Informed Consent: I have reviewed the patients History and Physical, chart, labs and discussed the procedure including the risks, benefits and alternatives for the proposed anesthesia with the patient or authorized representative who has indicated his/her understanding and acceptance.   Dental advisory given  Plan Discussed with: CRNA  Anesthesia Plan Comments:        Anesthesia Quick Evaluation

## 2014-09-12 NOTE — Anesthesia Postprocedure Evaluation (Signed)
  Anesthesia Post-op Note  Patient: Heather Hicks  Procedure(s) Performed: Procedure(s) (LRB): DILATATION AND CURETTAGE (N/A) DILATATION AND EVACUATION (N/A)  Patient Location: PACU  Anesthesia Type: General  Level of Consciousness: awake and alert   Airway and Oxygen Therapy: Patient Spontanous Breathing  Post-op Pain: mild  Post-op Assessment: Post-op Vital signs reviewed, Patient's Cardiovascular Status Stable, Respiratory Function Stable, Patent Airway and No signs of Nausea or vomiting  Last Vitals:  Filed Vitals:   09/12/14 1055  BP: 118/65  Pulse: 64  Temp: 36.7 C  Resp: 20    Post-op Vital Signs: stable   Complications: No apparent anesthesia complications

## 2014-09-12 NOTE — Discharge Instructions (Signed)
DISCHARGE INSTRUCTIONS: D&C / D&E The following instructions have been prepared to help you care for yourself upon your return home.  MAY REMOVE SCOP PATCH ON OR BEFORE 09/14/2014   MAY TAKE IBUPROFEN AFTER 3:30 PM AS NEEDED FOR CRAMPS  Personal hygiene:  Use sanitary pads for vaginal drainage, not tampons.  Shower the day after your procedure.  NO tub baths, pools or Jacuzzis for 2-3 weeks.  Wipe front to back after using the bathroom.  Activity and limitations:  Do NOT drive or operate any equipment for 24 hours. The effects of anesthesia are still present and drowsiness may result.  Do NOT rest in bed all day.  Walking is encouraged.  Walk up and down stairs slowly.  You may resume your normal activity in one to two days or as indicated by your physician.  Sexual activity: NO intercourse for at least 2 weeks after the procedure, or as indicated by your physician.  Diet: Eat a light meal as desired this evening. You may resume your usual diet tomorrow.  Return to work: You may resume your work activities in one to two days or as indicated by your doctor.  What to expect after your surgery: Expect to have vaginal bleeding/discharge for 2-3 days and spotting for up to 10 days. It is not unusual to have soreness for up to 1-2 weeks. You may have a slight burning sensation when you urinate for the first day. Mild cramps may continue for a couple of days. You may have a regular period in 2-6 weeks.  Call your doctor for any of the following:  Excessive vaginal bleeding, saturating and changing one pad every hour.  Inability to urinate 6 hours after discharge from hospital.  Pain not relieved by pain medication.  Fever of 100.4 F or greater.  Unusual vaginal discharge or odor.

## 2014-09-12 NOTE — H&P (Signed)
Heather Hicks is an 38 y.o. female.presents for surgical management of retained POCS  38 yo G5P1031 s/p 2 outpateint treatments with vaginal cytotec for uterine evacuation after diagnosis of a missed abortion. Patient continues to have irregular bleeding and ultrasound shows persistent POCs. Management options were reviewed and the decision was made to proceed with Dilation and Curettage for retained POCs. R/B/A of the procedure were discussed. Informed consent obtained.  No LMP recorded.    Past Medical History  Diagnosis Date  . Migraine   . 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 Tonsillectomy  1989  . Gallbladder surgery  2009  . Melanoma  2008    removed from neck  . Cesarean section N/A 05/22/2013    Procedure: Primary CESAREAN SECTION with Delivery Baby Girl @ 2230, Apgars 8/9;  Surgeon: Daria Pastures, MD;  Location: [facility];  Service: Obstetrics;  Laterality: N/A;    Family History  Problem Relation Age of Onset  . Diabetes Maternal Grandmother     Social History:  reports that she has never smoked. She does not have any smokeless tobacco history on file. She reports that she drinks alcohol. She reports that she does not use illicit drugs.  Allergies:  Allergies  Allergen Reactions  . Shellfish Allergy Other (See Comments)    Eyes swell shut  . Penicillins Rash    No prescriptions prior to admission    ROS: as above  Blood pressure 122/87, pulse 77, temperature 97.3 F (36.3 C), temperature source Oral, resp. rate 16, height 5\' 8"  (1.727 m), weight 104.327 kg (230 lb), SpO2 100 %, unknown if currently breastfeeding. Physical Exam  Results for orders placed or performed during the hospital encounter of 09/12/14 (from the past 24 hour(s))  CBC     Status: None   Collection Time: 09/12/14  7:30 AM  Result Value Ref Range   WBC 9.1 4.0 - 10.5 K/uL   RBC 4.88 3.87 - 5.11 MIL/uL   Hemoglobin 12.9 12.0 - 15.0 g/dL   HCT  40.0 36.0 - 46.0 %   MCV 82.0 78.0 - 100.0 fL   MCH 26.4 26.0 - 34.0 pg   MCHC 32.3 30.0 - 36.0 g/dL   RDW 13.1 11.5 - 15.5 %   Platelets 328 150 - 400 K/uL    No results found.  Assessment/Plan: 1) Admit 2) Proceed with dilation and curettage  Dandrea Medders H. 09/12/2014, 8:14 AM

## 2014-09-12 NOTE — Anesthesia Procedure Notes (Signed)
Procedure Name: LMA Insertion Date/Time: 09/12/2014 9:09 AM Performed by: Raenette Rover Pre-anesthesia Checklist: Patient identified, Emergency Drugs available, Suction available and Patient being monitored Patient Re-evaluated:Patient Re-evaluated prior to inductionOxygen Delivery Method: Circle system utilized Preoxygenation: Pre-oxygenation with 100% oxygen Intubation Type: IV induction Ventilation: Mask ventilation without difficulty LMA: LMA inserted LMA Size: 4.0 Number of attempts: 1 Placement Confirmation: positive ETCO2,  CO2 detector and breath sounds checked- equal and bilateral Tube secured with: Tape Dental Injury: Teeth and Oropharynx as per pre-operative assessment

## 2014-09-12 NOTE — Transfer of Care (Signed)
Immediate Anesthesia Transfer of Care Note  Patient: Heather Hicks  Procedure(s) Performed: Procedure(s): DILATATION AND CURETTAGE (N/A) DILATATION AND EVACUATION (N/A)  Patient Location: PACU  Anesthesia Type:General  Level of Consciousness: awake, alert , oriented and patient cooperative  Airway & Oxygen Therapy: Patient Spontanous Breathing and Patient connected to nasal cannula oxygen  Post-op Assessment: Report given to PACU RN and Post -op Vital signs reviewed and stable  Post vital signs: Reviewed and stable  Complications: No apparent anesthesia complications

## 2014-09-13 ENCOUNTER — Encounter (HOSPITAL_COMMUNITY): Payer: Self-pay | Admitting: Obstetrics and Gynecology

## 2014-10-10 NOTE — Op Note (Signed)
Pre-Operative Diagnosis: 1) Retained products of conception 2) Uterine fibroids Postoperative Diagnosis: Same Procedure: Suction Dilation and curettage under US guidance Surgeon: Dr. Vanessa Kick Assistant: None Operative Findings: Retained POCs Specimen: POCs EBL: Minimal  Ms. Schweppe Is a 39 year old gravida 5 para 74 who presents for definitive surgical management for retained POCs after attempted medical management of a missed abortion. Please see the patient's history and physical for complete details of the history. Management options were discussed with the patient. R/B/A reviewed. Following appropriate informed consent was taken to the operating room. The patient was appropriately identified during a time out procedure. MAC anesthesia was administered and the patient was placed in the dorsal lithotomy position. The patient was prepped and draped in the normal sterile fashion. A speculum was placed into the vagina, a single-tooth tenaculum was placed on the anterior lip of the cervix, and 10 cc of 1% lidocaine was administered in a paracervical fashion. The cervix was serially dilated with Hank dilators. Given the known presence of multiple uterine fibroids a bedside ultrasound was used to confirm complete evacuation of the uterus. A #7 suction curet was then passed to the fundus, the vacuum was engaged, and 3 suction passes were performed with a curette. A Sharp curettage was performed and a gritty texture was noted. A final suction pass was performed with minimal results. This completed the procedure. The patient tolerated the procedure well was brought to the recovery room in stable condition for the procedure. All sponge and needle counts correct x2.

## 2015-01-08 ENCOUNTER — Other Ambulatory Visit: Payer: Self-pay | Admitting: Sports Medicine

## 2015-01-08 DIAGNOSIS — M545 Low back pain: Secondary | ICD-10-CM

## 2015-01-09 ENCOUNTER — Ambulatory Visit
Admission: RE | Admit: 2015-01-09 | Discharge: 2015-01-09 | Disposition: A | Discharge status: Home / Self Care | Payer: BLUE CROSS/BLUE SHIELD | Source: Ambulatory Visit | Attending: Sports Medicine | Admitting: Sports Medicine

## 2015-01-09 DIAGNOSIS — M545 Low back pain: Secondary | ICD-10-CM

## 2015-09-24 ENCOUNTER — Other Ambulatory Visit: Payer: Self-pay | Admitting: Obstetrics and Gynecology

## 2015-09-26 LAB — CYTOLOGY - PAP

## 2016-02-19 ENCOUNTER — Other Ambulatory Visit: Payer: Self-pay | Admitting: Obstetrics and Gynecology

## 2016-02-19 DIAGNOSIS — Z36 Encounter for antenatal screening of mother: Secondary | ICD-10-CM | POA: Diagnosis not present

## 2016-02-19 DIAGNOSIS — Z3201 Encounter for pregnancy test, result positive: Secondary | ICD-10-CM | POA: Diagnosis not present

## 2016-02-19 DIAGNOSIS — N925 Other specified irregular menstruation: Secondary | ICD-10-CM | POA: Diagnosis not present

## 2016-02-19 DIAGNOSIS — Z3480 Encounter for supervision of other normal pregnancy, unspecified trimester: Secondary | ICD-10-CM | POA: Diagnosis not present

## 2016-02-19 DIAGNOSIS — Z01419 Encounter for gynecological examination (general) (routine) without abnormal findings: Secondary | ICD-10-CM | POA: Diagnosis not present

## 2016-03-09 DIAGNOSIS — O09511 Supervision of elderly primigravida, first trimester: Secondary | ICD-10-CM | POA: Diagnosis not present

## 2016-03-09 DIAGNOSIS — Z3480 Encounter for supervision of other normal pregnancy, unspecified trimester: Secondary | ICD-10-CM | POA: Diagnosis not present

## 2016-03-09 LAB — OB RESULTS CONSOLE RUBELLA ANTIBODY, IGM: RUBELLA: IMMUNE

## 2016-03-09 LAB — OB RESULTS CONSOLE HIV ANTIBODY (ROUTINE TESTING): HIV: NONREACTIVE

## 2016-03-09 LAB — OB RESULTS CONSOLE ABO/RH: RH Type: POSITIVE

## 2016-03-09 LAB — OB RESULTS CONSOLE RPR: RPR: NONREACTIVE

## 2016-03-09 LAB — OB RESULTS CONSOLE HEPATITIS B SURFACE ANTIGEN: Hepatitis B Surface Ag: NEGATIVE

## 2016-04-16 DIAGNOSIS — Z8279 Family history of other congenital malformations, deformations and chromosomal abnormalities: Secondary | ICD-10-CM | POA: Diagnosis not present

## 2016-04-16 DIAGNOSIS — O09529 Supervision of elderly multigravida, unspecified trimester: Secondary | ICD-10-CM | POA: Diagnosis not present

## 2016-04-16 DIAGNOSIS — O09522 Supervision of elderly multigravida, second trimester: Secondary | ICD-10-CM | POA: Diagnosis not present

## 2016-04-16 DIAGNOSIS — Z6836 Body mass index (BMI) 36.0-36.9, adult: Secondary | ICD-10-CM | POA: Diagnosis not present

## 2016-05-05 DIAGNOSIS — O09522 Supervision of elderly multigravida, second trimester: Secondary | ICD-10-CM | POA: Diagnosis not present

## 2016-06-09 DIAGNOSIS — L821 Other seborrheic keratosis: Secondary | ICD-10-CM | POA: Diagnosis not present

## 2016-06-09 DIAGNOSIS — Z85820 Personal history of malignant melanoma of skin: Secondary | ICD-10-CM | POA: Diagnosis not present

## 2016-06-09 DIAGNOSIS — D1723 Benign lipomatous neoplasm of skin and subcutaneous tissue of right leg: Secondary | ICD-10-CM | POA: Diagnosis not present

## 2016-06-09 DIAGNOSIS — L304 Erythema intertrigo: Secondary | ICD-10-CM | POA: Diagnosis not present

## 2016-07-12 DIAGNOSIS — Z3480 Encounter for supervision of other normal pregnancy, unspecified trimester: Secondary | ICD-10-CM | POA: Diagnosis not present

## 2016-07-12 DIAGNOSIS — Z23 Encounter for immunization: Secondary | ICD-10-CM | POA: Diagnosis not present

## 2016-07-22 DIAGNOSIS — O99810 Abnormal glucose complicating pregnancy: Secondary | ICD-10-CM | POA: Diagnosis not present

## 2016-07-22 DIAGNOSIS — O26849 Uterine size-date discrepancy, unspecified trimester: Secondary | ICD-10-CM | POA: Diagnosis not present

## 2016-08-05 DIAGNOSIS — N760 Acute vaginitis: Secondary | ICD-10-CM | POA: Diagnosis not present

## 2016-08-05 DIAGNOSIS — Z6839 Body mass index (BMI) 39.0-39.9, adult: Secondary | ICD-10-CM | POA: Diagnosis not present

## 2016-08-06 ENCOUNTER — Other Ambulatory Visit: Payer: Self-pay | Admitting: Obstetrics and Gynecology

## 2016-08-14 ENCOUNTER — Inpatient Hospital Stay (HOSPITAL_COMMUNITY): Payer: BLUE CROSS/BLUE SHIELD

## 2016-08-14 ENCOUNTER — Inpatient Hospital Stay (HOSPITAL_COMMUNITY)
Admission: AD | Admit: 2016-08-14 | Discharge: 2016-08-20 | DRG: 765 | Disposition: A | Discharge status: Home / Self Care | Payer: BLUE CROSS/BLUE SHIELD | Source: Ambulatory Visit | Attending: Obstetrics and Gynecology | Admitting: Obstetrics and Gynecology

## 2016-08-14 ENCOUNTER — Encounter (HOSPITAL_COMMUNITY): Payer: Self-pay

## 2016-08-14 DIAGNOSIS — O34219 Maternal care for unspecified type scar from previous cesarean delivery: Secondary | ICD-10-CM | POA: Diagnosis not present

## 2016-08-14 DIAGNOSIS — Z880 Allergy status to penicillin: Secondary | ICD-10-CM

## 2016-08-14 DIAGNOSIS — O3413 Maternal care for benign tumor of corpus uteri, third trimester: Secondary | ICD-10-CM | POA: Diagnosis present

## 2016-08-14 DIAGNOSIS — Z833 Family history of diabetes mellitus: Secondary | ICD-10-CM

## 2016-08-14 DIAGNOSIS — Z85820 Personal history of malignant melanoma of skin: Secondary | ICD-10-CM | POA: Diagnosis not present

## 2016-08-14 DIAGNOSIS — Z3A32 32 weeks gestation of pregnancy: Secondary | ICD-10-CM | POA: Diagnosis not present

## 2016-08-14 DIAGNOSIS — O42913 Preterm premature rupture of membranes, unspecified as to length of time between rupture and onset of labor, third trimester: Secondary | ICD-10-CM | POA: Diagnosis not present

## 2016-08-14 DIAGNOSIS — O42013 Preterm premature rupture of membranes, onset of labor within 24 hours of rupture, third trimester: Secondary | ICD-10-CM | POA: Diagnosis not present

## 2016-08-14 DIAGNOSIS — Z3689 Encounter for other specified antenatal screening: Secondary | ICD-10-CM | POA: Diagnosis not present

## 2016-08-14 DIAGNOSIS — D259 Leiomyoma of uterus, unspecified: Secondary | ICD-10-CM | POA: Diagnosis present

## 2016-08-14 DIAGNOSIS — O34211 Maternal care for low transverse scar from previous cesarean delivery: Principal | ICD-10-CM | POA: Diagnosis present

## 2016-08-14 DIAGNOSIS — O42919 Preterm premature rupture of membranes, unspecified as to length of time between rupture and onset of labor, unspecified trimester: Secondary | ICD-10-CM | POA: Diagnosis present

## 2016-08-14 DIAGNOSIS — O2293 Venous complication in pregnancy, unspecified, third trimester: Secondary | ICD-10-CM | POA: Diagnosis not present

## 2016-08-14 LAB — URINALYSIS, ROUTINE W REFLEX MICROSCOPIC
Bilirubin Urine: NEGATIVE
Glucose, UA: NEGATIVE mg/dL
Ketones, ur: NEGATIVE mg/dL
LEUKOCYTES UA: NEGATIVE
Nitrite: NEGATIVE
PROTEIN: 30 mg/dL — AB
Specific Gravity, Urine: 1.02 (ref 1.005–1.030)
pH: 6.5 (ref 5.0–8.0)

## 2016-08-14 LAB — CBC
HCT: 32.2 % — ABNORMAL LOW (ref 36.0–46.0)
HEMOGLOBIN: 10.7 g/dL — AB (ref 12.0–15.0)
MCH: 26.6 pg (ref 26.0–34.0)
MCHC: 33.2 g/dL (ref 30.0–36.0)
MCV: 79.9 fL (ref 78.0–100.0)
Platelets: 234 10*3/uL (ref 150–400)
RBC: 4.03 MIL/uL (ref 3.87–5.11)
RDW: 14.6 % (ref 11.5–15.5)
WBC: 11.8 10*3/uL — AB (ref 4.0–10.5)

## 2016-08-14 LAB — TYPE AND SCREEN
ABO/RH(D): AB POS
Antibody Screen: NEGATIVE

## 2016-08-14 LAB — POCT FERN TEST: POCT FERN TEST: POSITIVE

## 2016-08-14 LAB — URINE MICROSCOPIC-ADD ON: Bacteria, UA: NONE SEEN

## 2016-08-14 MED ORDER — PRENATAL MULTIVITAMIN CH
1.0000 | ORAL_TABLET | Freq: Every day | ORAL | Status: DC
  Filled 2016-08-14 (×2): qty 1

## 2016-08-14 MED ORDER — BETAMETHASONE SOD PHOS & ACET 6 (3-3) MG/ML IJ SUSP
12.0000 mg | Freq: Once | INTRAMUSCULAR | Status: AC
  Administered 2016-08-15: 12 mg via INTRAMUSCULAR
  Filled 2016-08-14: qty 2

## 2016-08-14 MED ORDER — MAGNESIUM SULFATE 50 % IJ SOLN
2.0000 g/h | INTRAVENOUS | Status: DC
  Administered 2016-08-14 – 2016-08-16 (×3): 2 g/h via INTRAVENOUS
  Filled 2016-08-14 (×3): qty 80

## 2016-08-14 MED ORDER — OXYCODONE HCL 5 MG PO TABS
5.0000 mg | ORAL_TABLET | ORAL | Status: DC | PRN
  Administered 2016-08-14 – 2016-08-16 (×6): 5 mg via ORAL
  Filled 2016-08-14 (×7): qty 1

## 2016-08-14 MED ORDER — CALCIUM CARBONATE ANTACID 500 MG PO CHEW
2.0000 | CHEWABLE_TABLET | ORAL | Status: DC | PRN
  Administered 2016-08-14 – 2016-08-16 (×4): 400 mg via ORAL
  Filled 2016-08-14 (×4): qty 2

## 2016-08-14 MED ORDER — ACETAMINOPHEN 325 MG PO TABS
650.0000 mg | ORAL_TABLET | ORAL | Status: DC | PRN
  Administered 2016-08-14: 650 mg via ORAL
  Filled 2016-08-14 (×2): qty 2

## 2016-08-14 MED ORDER — MAGNESIUM SULFATE BOLUS VIA INFUSION
4.0000 g | Freq: Once | INTRAVENOUS | Status: AC
  Administered 2016-08-14: 4 g via INTRAVENOUS
  Filled 2016-08-14: qty 500

## 2016-08-14 MED ORDER — DEXTROSE 5 % IV SOLN
500.0000 mg | INTRAVENOUS | Status: AC
  Administered 2016-08-14 – 2016-08-15 (×2): 500 mg via INTRAVENOUS
  Filled 2016-08-14 (×2): qty 500

## 2016-08-14 MED ORDER — PRENATAL MULTIVITAMIN CH
1.0000 | ORAL_TABLET | Freq: Every day | ORAL | Status: DC
  Administered 2016-08-14 – 2016-08-15 (×2): 1 via ORAL
  Filled 2016-08-14 (×2): qty 1

## 2016-08-14 MED ORDER — MAGNESIUM SULFATE 4 GM/100ML IV SOLN
4.0000 g | Freq: Once | INTRAVENOUS | Status: DC
Start: 2016-08-14 — End: 2016-08-14
  Filled 2016-08-14: qty 100

## 2016-08-14 MED ORDER — DOCUSATE SODIUM 100 MG PO CAPS
100.0000 mg | ORAL_CAPSULE | Freq: Every day | ORAL | Status: DC
  Administered 2016-08-15 – 2016-08-16 (×2): 100 mg via ORAL
  Filled 2016-08-14 (×3): qty 1

## 2016-08-14 MED ORDER — AZITHROMYCIN 500 MG PO TABS
500.0000 mg | ORAL_TABLET | Freq: Every day | ORAL | Status: DC
  Administered 2016-08-16: 500 mg via ORAL
  Filled 2016-08-14: qty 1
  Filled 2016-08-14: qty 2

## 2016-08-14 MED ORDER — MAGNESIUM SULFATE 50 % IJ SOLN
2.0000 g/h | INTRAVENOUS | Status: DC
  Filled 2016-08-14: qty 80

## 2016-08-14 MED ORDER — CEFAZOLIN IN D5W 1 GM/50ML IV SOLN
1.0000 g | Freq: Three times a day (TID) | INTRAVENOUS | Status: DC
  Administered 2016-08-14 – 2016-08-16 (×8): 1 g via INTRAVENOUS
  Filled 2016-08-14 (×11): qty 50

## 2016-08-14 MED ORDER — ZOLPIDEM TARTRATE 5 MG PO TABS
5.0000 mg | ORAL_TABLET | Freq: Every evening | ORAL | Status: DC | PRN
  Administered 2016-08-14 – 2016-08-15 (×2): 5 mg via ORAL
  Filled 2016-08-14 (×2): qty 1

## 2016-08-14 MED ORDER — BETAMETHASONE SOD PHOS & ACET 6 (3-3) MG/ML IJ SUSP
12.0000 mg | Freq: Once | INTRAMUSCULAR | Status: AC
  Administered 2016-08-14: 12 mg via INTRAMUSCULAR
  Filled 2016-08-14: qty 2

## 2016-08-14 MED ORDER — LACTATED RINGERS IV SOLN
INTRAVENOUS | Status: DC
  Administered 2016-08-14 – 2016-08-16 (×5): via INTRAVENOUS

## 2016-08-14 NOTE — MAU Provider Note (Signed)
Chief Complaint:  Rupture of Membranes    HPI: Heather Hicks is a 40 y.o. [id number] at [redacted]w[redacted]d who presents to maternity admissions reporting leaking of fluid.  Patient states she was awoken this morning feeling water per vagina, jumped out of bed, and a large gush of clear fluid fell onto the floor, this occurred about 3:30AM. She denies any complications this pregnancy. She has not felt contractions since her water broke. Denies any vaginal bleeding. Feeling good fetal movement. Her previous delivery was a cesarean 2/2 NRFHR.    Pregnancy Course: Uncomplicated  Past Medical History: Past Medical History:  Diagnosis Date  . Melanoma ([place]) 2008   neck  . Migraine     Past obstetric history: OB History  Gravida Para Term Preterm AB Living  4 1 1  0 2 1  SAB TAB Ectopic Multiple Live Births  1 1 0 0 1    # Outcome Date GA Lbr Len/2nd Weight Sex Delivery Anes PTL Lv  4 Current           3 Term 05/22/13 [redacted]w[redacted]d  7 lb 10.6 oz (3.476 kg) F CS-LTranv EPI  LIV  2 TAB           1 SAB               Past Surgical History: Past Surgical History:  Procedure Laterality Date  . CESAREAN SECTION N/A 05/22/2013   Procedure: Primary CESAREAN SECTION with Delivery Baby Girl @ 2230, Apgars 8/9;  Surgeon: Daria Pastures, MD;  Location: [facility];  Service: Obstetrics;  Laterality: N/A;  . DILATION AND CURETTAGE OF UTERUS N/A 09/12/2014   Procedure: DILATATION AND CURETTAGE;  Surgeon: Farrel Gobble. Harrington Challenger, MD;  Location: [facility];  Service: Gynecology;  Laterality: N/A;  . DILATION AND EVACUATION N/A 09/12/2014   Procedure: DILATATION AND EVACUATION;  Surgeon: Farrel Gobble. Harrington Challenger, MD;  Location: [facility];  Service: Gynecology;  Laterality: N/A;  . GALLBLADDER SURGERY  2009  . melanoma  2008   removed from neck  . TONSILLECTOMY  1989     Family History: Family History  Problem Relation Age of Onset  . Diabetes Maternal Grandmother     Social History: Social History  Substance Use Topics  . Smoking  status: Never Smoker  . Smokeless tobacco: Never Used  . Alcohol use Yes    Allergies:  Allergies  Allergen Reactions  . Shellfish Allergy Other (See Comments)    Eyes swell shut  . Penicillins Rash    Meds:  Prescriptions Prior to Admission  Medication Sig Dispense Refill Last Dose  . ibuprofen (ADVIL,MOTRIN) 600 MG tablet Take 1 tablet (600 mg total) by mouth every 6 (six) hours as needed. 90 tablet 0   . oxyCODONE-acetaminophen (ROXICET) 5-325 MG per tablet Take 2 tablets by mouth every 4 (four) hours as needed. May take 1-2 tablets every 4-6 hours as needed for pain 30 tablet 0     I have reviewed patient's Past Medical Hx, Surgical Hx, Family Hx, Social Hx, medications and allergies.   ROS:  A comprehensive ROS was negative except per HPI.    Physical Exam  Patient Vitals for the past 24 hrs:  BP Temp Temp src Pulse Resp  08/14/16 0544 127/65 97.8 F (36.6 C) Oral 99 18   Constitutional: Well-developed, well-nourished female in no acute distress.  Cardiovascular: normal rate, rhythm, no murmurs Respiratory: normal effort, CTAB GI: Abd soft, non-tender, gravid appropriate for gestational age. Pos BS x 4  MS: Extremities nontender, no edema, normal ROM Neurologic: Alert and oriented x 4.  GU: Neg CVAT. SSE: NEFG, +POOLING, clear fluid, no blood, cervix clean, visually <1cm dilated.   FHT:  Baseline 145 , moderate variability, accelerations present, no decelerations Contractions: Rare   Labs: Results for orders placed or performed during the hospital encounter of 08/14/16 (from the past 24 hour(s))  Urinalysis, Routine w reflex microscopic (not at [facility])     Status: Abnormal   Collection Time: 08/14/16  5:35 AM  Result Value Ref Range   Color, Urine YELLOW YELLOW   APPearance HAZY (A) CLEAR   Specific Gravity, Urine 1.020 1.005 - 1.030   pH 6.5 5.0 - 8.0   Glucose, UA NEGATIVE NEGATIVE mg/dL   Hgb urine dipstick MODERATE (A) NEGATIVE   Bilirubin Urine NEGATIVE  NEGATIVE   Ketones, ur NEGATIVE NEGATIVE mg/dL   Protein, ur 30 (A) NEGATIVE mg/dL   Nitrite NEGATIVE NEGATIVE   Leukocytes, UA NEGATIVE NEGATIVE  Urine microscopic-add on     Status: Abnormal   Collection Time: 08/14/16  5:35 AM  Result Value Ref Range   Squamous Epithelial / LPF 0-5 (A) NONE SEEN   WBC, UA 0-5 0 - 5 WBC/hpf   RBC / HPF 6-30 0 - 5 RBC/hpf   Bacteria, UA NONE SEEN NONE SEEN  Fern Test     Status: Abnormal   Collection Time: 08/14/16  5:51 AM  Result Value Ref Range   POCT Fern Test Positive = ruptured amniotic membanes     Imaging:  No results found.  MAU Course: NST - reactive SSE - grossly ruptured Fern POSITIVE GBS culture taken First dose of betamethasone given   MDM: Plan of care reviewed with patient, including labs and tests ordered and medical treatment.   Assessment/Plan: PPROM  Admit to inpatient Betamethasone course started GBS culture taken - pending GC/CT pending Latency antibiotics Care per [facility] physician    Katherine Basset, DO OB Fellow [facility], [facility] 08/14/2016 6:15 AM

## 2016-08-14 NOTE — H&P (Signed)
Heather Hicks is a 40 y.o. female presenting for leaking fluid  40 yo [id number] @ 32+3 presents for leaking fluid and is confirmed to have spontaneously ruptured her membranes. Pt awoke from sleeping to the sensation of fluid leaking.   Her pregnancy has been complicated by AMA. NIPT was low risk female. She has know uterine fibroids. [place] for growth on 07/22/16 showed EFW 1757 gm (3#14) 82%.  OB History    Gravida Para Term Preterm AB Living   4 1 1  0 2 1   SAB TAB Ectopic Multiple Live Births   1 1 0 0 1     Past Medical History:  Diagnosis Date  . Melanoma ([place]) 2008   neck  . Migraine    Past Surgical History:  Procedure Laterality Date  . CESAREAN SECTION N/A 05/22/2013   Procedure: Primary CESAREAN SECTION with Delivery Baby Girl @ 2230, Apgars 8/9;  Surgeon: Daria Pastures, MD;  Location: [facility];  Service: Obstetrics;  Laterality: N/A;  . DILATION AND CURETTAGE OF UTERUS N/A 09/12/2014   Procedure: DILATATION AND CURETTAGE;  Surgeon: Farrel Gobble. Harrington Challenger, MD;  Location: [facility];  Service: Gynecology;  Laterality: N/A;  . DILATION AND EVACUATION N/A 09/12/2014   Procedure: DILATATION AND EVACUATION;  Surgeon: Farrel Gobble. Harrington Challenger, MD;  Location: [facility];  Service: Gynecology;  Laterality: N/A;  . GALLBLADDER SURGERY  2009  . melanoma  2008   removed from neck  . TONSILLECTOMY  1989   Family History: family history includes Diabetes in her maternal grandmother. Social History:  reports that she has never smoked. She has never used smokeless tobacco. She reports that she does not drink alcohol or use drugs.     Maternal Diabetes: No, elevated 1 hr gtt, normal 3 hr Genetic Screening: Normal Maternal Ultrasounds/Referrals: Normal Fetal Ultrasounds or other Referrals:  None Maternal Substance Abuse:  No Significant Maternal Medications:  None Significant Maternal Lab Results:  None Other Comments:  None  ROS History   Blood pressure 115/75, pulse 98, temperature 98.5 F (36.9 C),  temperature source Oral, resp. rate 17, height 5\' 8"  (1.727 m), weight 117.5 kg (259 lb), SpO2 95 %, unknown if currently breastfeeding. Exam Physical Exam  Prenatal labs: ABO, Rh: --/--/AB POS (11/25 [id number]) Antibody: NEG (11/25 0706) Rubella:  imm RPR:   NR HBsAg:   Neg HIV:   NR GBS:   pending  Assessment/Plan: 1) Admit 2) Antibiotics for latency. Pt PCN allergic but has tolerated other cephalosporins. Will use ancef and azithro. 3) BMZ to enhance FLM 4) Magnesium sulfate for neuroprotection. May need to extend to cover steroid time 5) SCDs for DVT prophylaxis 6) F/U US for EFW 7) NICU without beds. Pt understands that If delivery is required the baby may need to be transferred to [facility]. Pt did not want to be transferred to antenatally for personal reasons 8) MOD: Hicks/O prior C/S for NRFWB. Pt had planned repeat C/S.    Heather Hicks. 08/14/2016, 11:26 AM

## 2016-08-14 NOTE — MAU Note (Signed)
Pt presents complaining of SROM at 0330 with copious amounts of clear fluid. Denies pain. Denies bleeding. Reports good fetal movement. Repeat c/s in January.

## 2016-08-15 MED ORDER — PANTOPRAZOLE SODIUM 40 MG PO TBEC
40.0000 mg | DELAYED_RELEASE_TABLET | Freq: Every day | ORAL | Status: DC
  Administered 2016-08-15 – 2016-08-16 (×2): 40 mg via ORAL
  Filled 2016-08-15 (×2): qty 1

## 2016-08-15 NOTE — Progress Notes (Addendum)
Patient ID: Heather Hicks, female   DOB: 1976/08/25, 40 y.o.   MRN: [id number]  HD#[id number] 40 yo [id number] @ 32+4 with PPROM  S: Continues to leak fluid. Ocassional contractions, active fetal movement. Denies VB. C/O headache related to Magnesium Sulfate O:  Vitals:   08/15/16 0900 08/15/16 0945 08/15/16 1137 08/15/16 1138  BP:    (!) 84/55  Pulse:    97  Resp: 16 17    Temp:   98.4 F (36.9 C)   TempSrc:   Oral   SpO2:      Weight:      Height:       AOX3, NAD NST reactive, cat 1 tracing Cvx deferred  [place]: vtx EFW 2537 gm (5#9) 88% AFI 7.7cm  A/P: 40 yo G5P1031 @ 32+4 with PPROM 1) Continue Bedrest with BRP 2) S/P BMZ x 2, steroid time complete tomorrow Am  3) SCDs 4) Will D/C Mag Sulfate with steroid time 5) Continue Abx for latency

## 2016-08-16 ENCOUNTER — Inpatient Hospital Stay (HOSPITAL_COMMUNITY): Payer: BLUE CROSS/BLUE SHIELD | Admitting: Anesthesiology

## 2016-08-16 ENCOUNTER — Encounter (HOSPITAL_COMMUNITY): Payer: Self-pay | Admitting: Certified Registered Nurse Anesthetist

## 2016-08-16 ENCOUNTER — Encounter (HOSPITAL_COMMUNITY)
Admission: AD | Disposition: A | Discharge status: Home / Self Care | Payer: Self-pay | Source: Ambulatory Visit | Attending: Obstetrics and Gynecology

## 2016-08-16 LAB — CULTURE, BETA STREP (GROUP B ONLY)

## 2016-08-16 SURGERY — Surgical Case
Anesthesia: Spinal

## 2016-08-16 MED ORDER — CEFAZOLIN SODIUM-DEXTROSE 2-3 GM-% IV SOLR
INTRAVENOUS | Status: DC | PRN
  Administered 2016-08-16: 2 g via INTRAVENOUS

## 2016-08-16 MED ORDER — TETANUS-DIPHTH-ACELL PERTUSSIS 5-2.5-18.5 LF-MCG/0.5 IM SUSP: 0.5000 mL | Freq: Once | INTRAMUSCULAR | Status: DC

## 2016-08-16 MED ORDER — OXYTOCIN 40 UNITS IN LACTATED RINGERS INFUSION - SIMPLE MED: 2.5000 [IU]/h | INTRAVENOUS | Status: AC

## 2016-08-16 MED ORDER — OXYTOCIN 10 UNIT/ML IJ SOLN
INTRAMUSCULAR | Status: AC
  Filled 2016-08-16: qty 4

## 2016-08-16 MED ORDER — OXYTOCIN 10 UNIT/ML IJ SOLN
INTRAVENOUS | Status: DC | PRN
  Administered 2016-08-16: 40 [IU] via INTRAVENOUS

## 2016-08-16 MED ORDER — ACETAMINOPHEN 325 MG PO TABS
650.0000 mg | ORAL_TABLET | ORAL | Status: DC | PRN
  Administered 2016-08-17 – 2016-08-20 (×15): 650 mg via ORAL
  Filled 2016-08-16 (×15): qty 2

## 2016-08-16 MED ORDER — SIMETHICONE 80 MG PO CHEW
80.0000 mg | CHEWABLE_TABLET | Freq: Three times a day (TID) | ORAL | Status: DC
  Administered 2016-08-17 – 2016-08-20 (×11): 80 mg via ORAL
  Filled 2016-08-16 (×10): qty 1

## 2016-08-16 MED ORDER — SCOPOLAMINE 1 MG/3DAYS TD PT72
MEDICATED_PATCH | TRANSDERMAL | Status: AC
  Filled 2016-08-16: qty 1

## 2016-08-16 MED ORDER — FENTANYL CITRATE (PF) 100 MCG/2ML IJ SOLN
INTRAMUSCULAR | Status: AC
  Filled 2016-08-16: qty 2

## 2016-08-16 MED ORDER — HYDROMORPHONE HCL 1 MG/ML IJ SOLN
INTRAMUSCULAR | Status: DC | PRN
  Administered 2016-08-16: 1 mg via INTRAVENOUS

## 2016-08-16 MED ORDER — WITCH HAZEL-GLYCERIN EX PADS: 1.0000 "application " | MEDICATED_PAD | CUTANEOUS | Status: DC | PRN

## 2016-08-16 MED ORDER — MORPHINE SULFATE-NACL 0.5-0.9 MG/ML-% IV SOSY
PREFILLED_SYRINGE | INTRAVENOUS | Status: DC | PRN
  Administered 2016-08-16: .1 mg via INTRATHECAL

## 2016-08-16 MED ORDER — HYDROMORPHONE HCL 1 MG/ML IJ SOLN
INTRAMUSCULAR | Status: AC
  Filled 2016-08-16: qty 1

## 2016-08-16 MED ORDER — ONDANSETRON HCL 4 MG/2ML IJ SOLN
INTRAMUSCULAR | Status: DC | PRN
  Administered 2016-08-16: 4 mg via INTRAVENOUS

## 2016-08-16 MED ORDER — PHENYLEPHRINE 8 MG IN D5W 100 ML (0.08MG/ML) PREMIX OPTIME
INJECTION | INTRAVENOUS | Status: DC | PRN
  Administered 2016-08-16: 60 ug/min via INTRAVENOUS

## 2016-08-16 MED ORDER — SOD CITRATE-CITRIC ACID 500-334 MG/5ML PO SOLN
ORAL | Status: AC
  Administered 2016-08-16: 16:00:00
  Filled 2016-08-16: qty 15

## 2016-08-16 MED ORDER — COCONUT OIL OIL
1.0000 "application " | TOPICAL_OIL | Status: DC | PRN
  Administered 2016-08-18: 1 via TOPICAL
  Filled 2016-08-16: qty 120

## 2016-08-16 MED ORDER — MENTHOL 3 MG MT LOZG: 1.0000 | LOZENGE | OROMUCOSAL | Status: DC | PRN

## 2016-08-16 MED ORDER — ONDANSETRON HCL 4 MG/2ML IJ SOLN
INTRAMUSCULAR | Status: AC
  Filled 2016-08-16: qty 2

## 2016-08-16 MED ORDER — PRENATAL MULTIVITAMIN CH
1.0000 | ORAL_TABLET | Freq: Every day | ORAL | Status: DC
  Administered 2016-08-17 – 2016-08-20 (×4): 1 via ORAL
  Filled 2016-08-16 (×4): qty 1

## 2016-08-16 MED ORDER — PANTOPRAZOLE SODIUM 40 MG PO TBEC
40.0000 mg | DELAYED_RELEASE_TABLET | Freq: Every day | ORAL | Status: DC
  Administered 2016-08-17 – 2016-08-20 (×4): 40 mg via ORAL
  Filled 2016-08-16 (×4): qty 1

## 2016-08-16 MED ORDER — MORPHINE SULFATE-NACL 0.5-0.9 MG/ML-% IV SOSY
PREFILLED_SYRINGE | INTRAVENOUS | Status: AC
  Filled 2016-08-16: qty 1

## 2016-08-16 MED ORDER — SIMETHICONE 80 MG PO CHEW
80.0000 mg | CHEWABLE_TABLET | ORAL | Status: DC
  Administered 2016-08-16 – 2016-08-20 (×4): 80 mg via ORAL
  Filled 2016-08-16 (×4): qty 1

## 2016-08-16 MED ORDER — CHLOROPROCAINE HCL (PF) 3 % IJ SOLN
INTRAMUSCULAR | Status: AC
  Filled 2016-08-16: qty 20

## 2016-08-16 MED ORDER — LACTATED RINGERS IV BOLUS (SEPSIS)
1000.0000 mL | Freq: Once | INTRAVENOUS | Status: AC
  Administered 2016-08-16: 1000 mL via INTRAVENOUS

## 2016-08-16 MED ORDER — LACTATED RINGERS IV SOLN
INTRAVENOUS | Status: DC | PRN
  Administered 2016-08-16 (×2): via INTRAVENOUS

## 2016-08-16 MED ORDER — OXYCODONE HCL 5 MG PO TABS
10.0000 mg | ORAL_TABLET | ORAL | Status: DC | PRN
  Administered 2016-08-16 – 2016-08-20 (×18): 10 mg via ORAL
  Filled 2016-08-16 (×19): qty 2

## 2016-08-16 MED ORDER — OXYCODONE HCL 5 MG PO TABS
5.0000 mg | ORAL_TABLET | ORAL | Status: DC | PRN
  Administered 2016-08-19: 5 mg via ORAL
  Filled 2016-08-16: qty 1

## 2016-08-16 MED ORDER — DIPHENHYDRAMINE HCL 25 MG PO CAPS
25.0000 mg | ORAL_CAPSULE | Freq: Four times a day (QID) | ORAL | Status: DC | PRN
  Administered 2016-08-16: 25 mg via ORAL
  Filled 2016-08-16: qty 1

## 2016-08-16 MED ORDER — SENNOSIDES-DOCUSATE SODIUM 8.6-50 MG PO TABS
2.0000 | ORAL_TABLET | ORAL | Status: DC
  Administered 2016-08-16 – 2016-08-20 (×4): 2 via ORAL
  Filled 2016-08-16 (×4): qty 2

## 2016-08-16 MED ORDER — HYDROMORPHONE HCL 1 MG/ML IJ SOLN
0.2500 mg | INTRAMUSCULAR | Status: DC | PRN
  Administered 2016-08-16: 0.5 mg via INTRAVENOUS
  Administered 2016-08-16: 0.25 mg via INTRAVENOUS

## 2016-08-16 MED ORDER — PHENYLEPHRINE 8 MG IN D5W 100 ML (0.08MG/ML) PREMIX OPTIME
INJECTION | INTRAVENOUS | Status: AC
  Filled 2016-08-16: qty 100

## 2016-08-16 MED ORDER — SIMETHICONE 80 MG PO CHEW: 80.0000 mg | CHEWABLE_TABLET | ORAL | Status: DC | PRN

## 2016-08-16 MED ORDER — SCOPOLAMINE 1 MG/3DAYS TD PT72
MEDICATED_PATCH | TRANSDERMAL | Status: DC | PRN
  Administered 2016-08-16: 1 via TRANSDERMAL

## 2016-08-16 MED ORDER — LACTATED RINGERS IV SOLN
INTRAVENOUS | Status: DC
  Administered 2016-08-17 (×2): via INTRAVENOUS

## 2016-08-16 MED ORDER — FENTANYL CITRATE (PF) 100 MCG/2ML IJ SOLN
INTRAMUSCULAR | Status: DC | PRN
  Administered 2016-08-16 (×3): 50 ug via INTRAVENOUS

## 2016-08-16 MED ORDER — DIBUCAINE 1 % RE OINT: 1.0000 "application " | TOPICAL_OINTMENT | RECTAL | Status: DC | PRN

## 2016-08-16 MED ORDER — MEPERIDINE HCL 25 MG/ML IJ SOLN: 6.2500 mg | INTRAMUSCULAR | Status: DC | PRN

## 2016-08-16 MED ORDER — IBUPROFEN 600 MG PO TABS
600.0000 mg | ORAL_TABLET | Freq: Four times a day (QID) | ORAL | Status: DC
  Administered 2016-08-16 – 2016-08-20 (×14): 600 mg via ORAL
  Filled 2016-08-16 (×14): qty 1

## 2016-08-16 MED ORDER — OMEPRAZOLE MAGNESIUM 20 MG PO TBEC: 20.0000 mg | DELAYED_RELEASE_TABLET | Freq: Every day | ORAL | Status: DC

## 2016-08-16 MED ORDER — LACTATED RINGERS IV SOLN
INTRAVENOUS | Status: DC | PRN
  Administered 2016-08-16: 17:00:00 via INTRAVENOUS

## 2016-08-16 SURGICAL SUPPLY — 38 items
ADH SKN CLS APL DERMABOND .7 (GAUZE/BANDAGES/DRESSINGS)
APL SKNCLS STERI-STRIP NONHPOA (GAUZE/BANDAGES/DRESSINGS) ×1
BENZOIN TINCTURE PRP APPL 2/3 (GAUZE/BANDAGES/DRESSINGS) ×1 IMPLANT
CHLORAPREP W/TINT 26ML (MISCELLANEOUS) ×2 IMPLANT
CLAMP CORD UMBIL (MISCELLANEOUS) IMPLANT
CLOSURE STERI STRIP 1/2 X4 (GAUZE/BANDAGES/DRESSINGS) ×1 IMPLANT
CLOTH BEACON ORANGE TIMEOUT ST (SAFETY) ×2 IMPLANT
DERMABOND ADVANCED (GAUZE/BANDAGES/DRESSINGS)
DERMABOND ADVANCED .7 DNX12 (GAUZE/BANDAGES/DRESSINGS) IMPLANT
DRSG OPSITE POSTOP 4X10 (GAUZE/BANDAGES/DRESSINGS) ×2 IMPLANT
ELECT REM PT RETURN 9FT ADLT (ELECTROSURGICAL) ×2
ELECTRODE REM PT RTRN 9FT ADLT (ELECTROSURGICAL) ×1 IMPLANT
EXTRACTOR VACUUM M CUP 4 TUBE (SUCTIONS) IMPLANT
GLOVE BIO SURGEON STRL SZ7 (GLOVE) ×2 IMPLANT
GLOVE BIOGEL PI IND STRL 7.0 (GLOVE) ×1 IMPLANT
GLOVE BIOGEL PI INDICATOR 7.0 (GLOVE) ×1
GOWN STRL REUS W/TWL LRG LVL3 (GOWN DISPOSABLE) ×4 IMPLANT
KIT ABG SYR 3ML LUER SLIP (SYRINGE) IMPLANT
NDL HYPO 25X5/8 SAFETYGLIDE (NEEDLE) IMPLANT
NEEDLE HYPO 22GX1.5 SAFETY (NEEDLE) IMPLANT
NEEDLE HYPO 25X5/8 SAFETYGLIDE (NEEDLE) IMPLANT
NS IRRIG 1000ML POUR BTL (IV SOLUTION) ×2 IMPLANT
PACK C SECTION WH (CUSTOM PROCEDURE TRAY) ×2 IMPLANT
PAD OB MATERNITY 4.3X12.25 (PERSONAL CARE ITEMS) ×2 IMPLANT
PENCIL SMOKE EVAC W/HOLSTER (ELECTROSURGICAL) ×2 IMPLANT
RETAINER VISCERAL (MISCELLANEOUS) ×1 IMPLANT
RTRCTR C-SECT PINK 25CM LRG (MISCELLANEOUS) ×2 IMPLANT
SUT CHROMIC 1 CTX 36 (SUTURE) ×4 IMPLANT
SUT CHROMIC 2 0 CT 1 (SUTURE) ×2 IMPLANT
SUT MNCRL 0 VIOLET CTX 36 (SUTURE) IMPLANT
SUT MONOCRYL 0 CTX 36 (SUTURE) ×1
SUT PDS AB 0 CTX 60 (SUTURE) ×2 IMPLANT
SUT VIC AB 2-0 CT1 27 (SUTURE) ×2
SUT VIC AB 2-0 CT1 TAPERPNT 27 (SUTURE) ×1 IMPLANT
SUT VIC AB 4-0 KS 27 (SUTURE) IMPLANT
SYR 30ML LL (SYRINGE) IMPLANT
TOWEL OR 17X24 6PK STRL BLUE (TOWEL DISPOSABLE) ×2 IMPLANT
TRAY FOLEY CATH SILVER 14FR (SET/KITS/TRAYS/PACK) ×2 IMPLANT

## 2016-08-16 NOTE — Transfer of Care (Signed)
Immediate Anesthesia Transfer of Care Note  Patient: Heather Hicks  Procedure(s) Performed: Procedure(s) with comments: REPEAT CESAREAN SECTION (N/A) - REQUESTING RNFA  PLEASE  Patient Location: PACU  Anesthesia Type:Spinal  Level of Consciousness: awake, alert , oriented and patient cooperative  Airway & Oxygen Therapy: Patient Spontanous Breathing  Post-op Assessment: Report given to RN and Post -op Vital signs reviewed and stable  Post vital signs: Reviewed and stable  Last Vitals:  Vitals:   08/16/16 1203 08/16/16 1557  BP: 123/65   Pulse: 85   Resp: 16   Temp: 36.8 C 36.9 C    Last Pain:  Vitals:   08/16/16 1557  TempSrc: Oral  PainSc:       Patients Stated Pain Goal: 3 ([id number])  Complications: No apparent anesthesia complications

## 2016-08-16 NOTE — Anesthesia Procedure Notes (Signed)
Spinal  Patient location during procedure: OR Preanesthetic Checklist Completed: patient identified, site marked, surgical consent, pre-op evaluation, timeout performed, IV checked, risks and benefits discussed and monitors and equipment checked Spinal Block Patient position: sitting Prep: DuraPrep Patient monitoring: heart rate, cardiac monitor, continuous pulse ox and blood pressure Approach: midline Location: L3-4 Injection technique: single-shot Needle Needle type: Sprotte  Needle gauge: 24 G Needle length: 9 cm Assessment Sensory level: T4 Additional Notes Spinal Dosage in OR  Bupivicaine ml       1.8 PFMS04   mcg        100

## 2016-08-16 NOTE — Progress Notes (Signed)
Late Entry:   I was called by RN at 1457 due to contractions.  She noted that the patient c/o 6-7 ctx in the past hour that were mildly painful.  She was placed on the EFM and toco--no contractions were tracing and fetal status was reassuring.  I ordered a 1L IVF bolus and expectant management.  At 1451, I received a call that the intensity of the contractions suddenly increased and she was feeling pressure.  On SVE, she was 7/100/0 station.  Vertex was confirmed w [place].    The decision was made to proceed with delivery due to preterm labor in the setting of PPROM.  She declined a TOLAC.  She was consented for a RCS, with risks to include infection, bleeding, damage to surrounding structures (including but not limited to bowel, bladder, tubes, ovaries, nerves, vessels), need for additional procedures, vte, need for blood transfusion.  Consent was signed.  She declined BTL

## 2016-08-16 NOTE — Anesthesia Postprocedure Evaluation (Signed)
Anesthesia Post Note  Patient: Heather Hicks  Procedure(s) Performed: Procedure(s) (LRB): REPEAT CESAREAN SECTION (N/A)  Patient location during evaluation: PACU Anesthesia Type: Spinal Level of consciousness: awake and alert Pain management: pain level controlled Vital Signs Assessment: post-procedure vital signs reviewed and stable Respiratory status: spontaneous breathing and respiratory function stable Cardiovascular status: blood pressure returned to baseline and stable Postop Assessment: no headache, no backache and spinal receding Anesthetic complications: no     Last Vitals:  Vitals:   08/16/16 1800 08/16/16 1815  BP: (!) 103/50 119/65  Pulse: 67 63  Resp: 15 16  Temp:      Last Pain:  Vitals:   08/16/16 1800  TempSrc:   PainSc: 0-No pain   Pain Goal: Patients Stated Pain Goal: 3 (08/16/16 0800)               Montez Hageman

## 2016-08-16 NOTE — Anesthesia Preprocedure Evaluation (Addendum)
Anesthesia Evaluation  Patient identified by MRN, date of birth, ID band Patient awake    Reviewed: Allergy & Precautions, H&P , Patient's Chart, lab work & pertinent test results  Airway Mallampati: II  TM Distance: >3 FB Neck ROM: full    Dental no notable dental hx.    Pulmonary    Pulmonary exam normal breath sounds clear to auscultation       Cardiovascular Exercise Tolerance: Good  Rhythm:regular Rate:Normal     Neuro/Psych    GI/Hepatic   Endo/Other    Renal/GU      Musculoskeletal   Abdominal   Peds  Hematology   Anesthesia Other Findings   Reproductive/Obstetrics                             Anesthesia Physical Anesthesia Plan  ASA: III and emergent  Anesthesia Plan: Spinal   Post-op Pain Management:    Induction:   Airway Management Planned:   Additional Equipment:   Intra-op Plan:   Post-operative Plan:   Informed Consent: I have reviewed the patients History and Physical, chart, labs and discussed the procedure including the risks, benefits and alternatives for the proposed anesthesia with the patient or authorized representative who has indicated his/her understanding and acceptance.   Dental Advisory Given  Plan Discussed with: CRNA  Anesthesia Plan Comments: (Lab work confirmed with CRNA in room. Platelets okay. Discussed spinal anesthetic, and patient consents to the procedure:  included risk of possible headache,backache, failed block, allergic reaction, and nerve injury. This patient was asked if she had any questions or concerns before the procedure started. )        Anesthesia Quick Evaluation

## 2016-08-16 NOTE — Lactation Note (Signed)
This note was copied from a baby's chart. Lactation Consultation Note  Patient Name: Heather Hicks [id number] Date: 08/16/2016 Reason for consult: Initial assessment Baby at 4 hr of life and in the NICU. Mom stated that she wants to bf baby when he is able to start latching. Set up DEBP for her to use q3hr around the clock. Discussed pumping frequency, milk handling/storage, supplementing, NS use, breast changes, and nipple care. She is worried about supply because her daughter had a hard time latching then she had low milk supply. Given lactation handouts. Aware of OP services and support group.      Maternal Data    Feeding    LATCH Score/Interventions                      Lactation Tools Discussed/Used Pump Review: Setup, frequency, and cleaning;Milk Storage Initiated by:: ES Date initiated:: 08/16/16   Consult Status Consult Status: Follow-up Date: 08/17/16 Follow-up type: In-patient    Denzil Hughes 08/16/2016, 11:09 PM

## 2016-08-16 NOTE — Progress Notes (Signed)
Patient ID: Heather Hicks, female   DOB: 08-12-1976, 40 y.o.   MRN: [id number]  HD#[id number] 40 yo [id number] @ [redacted]w[redacted]d with PPROM  S: Continues to leak fluid. Ocassional contractions. Denies VB. Reports two episodes of pink-tinged fluid in last day.  Reports fetal movement, but less than normal O:  Vitals:   08/16/16 0649 08/16/16 0700 08/16/16 0800 08/16/16 1203  BP:   (!) 103/47 123/65  Pulse:   80 85  Resp: 18 18 16 16   Temp:   98.1 F (36.7 C) 98.3 F (36.8 C)  TempSrc:   Oral Oral  SpO2:      Weight:      Height:       AOX3, NAD Abd: soft, gravid, no fundal tenderness NST reactive, cat 1 tracing.  Toco quiet Cvx deferred  [place]: vtx EFW 2537 gm (5#9) 88% AFI 7.7cm  A/P: 40 yo G5P1031 @ [redacted]w[redacted]d with PPROM 1. Cont inpatient antepartum management 2. Prematurity--BMZ complete.   Will obtain NICU consult 3. PPROM: cont latency antibiotics s/sx intra-amniotic infection 4.  Prior cesarean delivery for NRFS at 7cm.  Previously planned for RCS at term--discussed TOLAC vs RCS.   5. FSR--given subjective decrease in fetal movement this AM, will keep on continuous monitoring at this time 6. SCDs

## 2016-08-16 NOTE — Op Note (Signed)
Cesarean Section Procedure Note  Pre-operative Diagnosis: 1. Intrauterine pregnancy at [redacted]w[redacted]d  2. Premature preterm rupture of membranes  3. Preterm labor 4. Prior cesarean section, declined trial of labor  Post-operative Diagnosis: same as above  Surgeon: Jerelyn Charles, MD  Procedure: Repeat low transverse cesarean section   Anesthesia: Spinal anesthesia  Estimated Blood Loss: 500 mL         Drains: Foley catheter         Specimens: placenta to pathology         Implants: none         Complications:  None; patient tolerated the procedure well.         Disposition: PACU - hemodynamically stable.  Findings:  Normal uterus, tubes and ovaries bilaterally.  Viable female infant, 81g (6lb 2oz) Apgars 7, 9.    Procedure Details   After spinal anesthesia was found to adequate , the patient was placed in the dorsal supine position with a leftward tilt, draped and prepped in the usual sterile manner. A Pfannenstiel incision was made and carried down through the subcutaneous tissue to the fascia. The fascia was incised in the midline and the fascial incision was extended laterally with Mayo scissors. The superior aspect of the fascial incision was grasped with two Kocher clamp, tented up and the rectus muscles dissected off sharply. The rectus was then dissected off with blunt dissection and Mayo scissors inferiorly. The rectus muscles were separated in the midline. The abdominal peritoneum was identified, tented up, entered bluntly, and the incision was extended superiorly and inferiorly with good visualization of the bladder. The Alexis retractor was deployed. The vesicouterine peritoneum was identified, tented up, entered sharply, and the bladder flap was created digitally. Scalpel was then used to make a low transverse incision on the uterus. The lower uterine segment was not well developed.  The hysterotomy was extended laterally with the bandage scissors.  The fluid was clear. The fetal vertex  was identified, elevated out of the pelvis and brought to the hysterotomy. The head was delivered easily followed by the shoulders and body. The infant cried immediately following delivery, so delayed cord clamping for sixty seconds per NICU protocol was performed.  The cord was clamped and cut and the infant was passed to the waiting neonatologist. Placenta was then delivered spontaneously, intact and appear normal, the uterus was cleared of all clot and debris.    The hysterotomy was repaired with #0 Monocryl in running locked fashion.  A second imbricating layer was placed using the same suture.  Multiple additional figure of eight suture was placed for hemostasis.  At this time, the patient was experiencing significant discomfort with uterine manipulation.  Per anesthesia request, 20 mL of 3% nesacaine was placed intra-abdominally. The serosal edges of the incision were oozy, and bovie cautery was used to achieve hemostasis.  The hysterotomy was reexamined and excellent hemostasis was noted.  The Alexis retractor was removed from the abdomen. The peritoneum was examined and all vessels noted to be hemostatic. The abdominal cavity was cleared of all clot and debris.  The peritoneum was closed with 2-0 vicryl in a running fashion. The fascia and rectus muscles were inspected and were hemostatic. The fascia was closed with 0 Vicryl in a running fashion. The subcuticular layer was irrigated and all bleeders cauterized.  The subcutaneous layer was re approximated with interrupted 3-0 plain gut.  The skin was closed with 3-0 monocryl in a subcuticular fashion. The incision was dressed with benzoine,  steri strips and pressure dressing. All sponge lap and needle counts were correct x3. Patient tolerated the procedure well and recovered in stable condition following the procedure.

## 2016-08-17 ENCOUNTER — Encounter (HOSPITAL_COMMUNITY): Payer: Self-pay | Admitting: Obstetrics

## 2016-08-17 LAB — CBC
HCT: 28.8 % — ABNORMAL LOW (ref 36.0–46.0)
HEMOGLOBIN: 9.1 g/dL — AB (ref 12.0–15.0)
MCH: 25.8 pg — AB (ref 26.0–34.0)
MCHC: 31.6 g/dL (ref 30.0–36.0)
MCV: 81.6 fL (ref 78.0–100.0)
Platelets: 198 10*3/uL (ref 150–400)
RBC: 3.53 MIL/uL — AB (ref 3.87–5.11)
RDW: 15 % (ref 11.5–15.5)
WBC: 12 10*3/uL — AB (ref 4.0–10.5)

## 2016-08-17 NOTE — Plan of Care (Signed)
Problem: Physical Regulation: Goal: Ability to maintain clinical measurements within normal limits will improve Outcome: Progressing VSS. Small amount of lochia with movement - no clots. Able to ambulate with 2 nurses to SBA. Aox4. Will continue to monitor.  Goal: Will remain free from infection Outcome: Progressing Pt is afebrile. Labs to be repeated in Am. tx for GBS in antepartum.   Problem: Skin Integrity: Goal: Risk for impaired skin integrity will decrease Outcome: Progressing Pt surgical incision CDI - closed with benzoine, adhesive strips, and honeycomb dressing.   Problem: Activity: Goal: Risk for activity intolerance will decrease Outcome: Progressing Pt is able to get up to side of bed and ambulate to bathroom with 2 nurses to SBA.  Problem: Fluid Volume: Goal: Ability to maintain a balanced intake and output will improve Outcome: Progressing LR with pitocin infusing at 62.52ml/hr. Pt is able to tolerate water and apple juice. Foley draining amber urine - encouraged patient to continue to drink.   Problem: Nutrition: Goal: Adequate nutrition will be maintained Outcome: Progressing Pt able to tolerate water, apple juice, crackers this evening.   Problem: Bowel/Gastric: Goal: Will not experience complications related to bowel motility Outcome: Progressing Pt is unsure if she has passed flatus at this time - last bowel movement was on 11/27 - bowel sounds present. Stool softeners and simethicone given per MAR.

## 2016-08-17 NOTE — Anesthesia Postprocedure Evaluation (Signed)
Anesthesia Post Note  Patient: WYLDER LAVECK  Procedure(s) Performed: Procedure(s) (LRB): REPEAT CESAREAN SECTION (N/A)  Patient location during evaluation: Mother Baby Anesthesia Type: Spinal Level of consciousness: awake Pain management: pain level controlled Vital Signs Assessment: post-procedure vital signs reviewed and stable Respiratory status: spontaneous breathing Cardiovascular status: stable Postop Assessment: no headache, no backache, spinal receding and patient able to bend at knees Anesthetic complications: no     Last Vitals:  Vitals:   08/16/16 2338 08/17/16 0325  BP: (!) 116/53 119/73  Pulse: 74 66  Resp: 16 16  Temp: 36.9 C 36.9 C    Last Pain:  Vitals:   08/17/16 0400  TempSrc:   PainSc: 2    Pain Goal: Patients Stated Pain Goal: 3 (08/16/16 0800)               Everette Rank

## 2016-08-17 NOTE — Addendum Note (Signed)
Addendum  created 08/17/16 0753 by Georgeanne Nim, CRNA   Charge Capture section accepted, Sign clinical note

## 2016-08-17 NOTE — Progress Notes (Signed)
POD#1 Pt without complaints. Had a pain in RLQ yesterday, none since. Lochia wnl VSSAF hgb-9.1 Imp/ Stable Plan/ Routine care.

## 2016-08-17 NOTE — Progress Notes (Signed)
Pt c/o severe right sided abdominal pain with sitting up in bed. Bowel sounds present, patient abdomen is soft to palpation, continuing to have small amount of lochia. After given time to sit at edge of bed, with help of two nurses, patient is able to stand and ambulate to and from bathroom. Reassured patient this pain is expected with c-section/incisional pain. Will continue to given PRN pain medication per [facility] and monitor patient vitals and assessments. Patient reassured by this and states she understands.

## 2016-08-18 LAB — GC/CHLAMYDIA PROBE AMP (~~LOC~~) NOT AT ARMC
Chlamydia: NEGATIVE
Neisseria Gonorrhea: NEGATIVE

## 2016-08-18 NOTE — Progress Notes (Signed)
Pt and family are doing well and are grateful and relieved that their son is doing well.  They are still processing their unexpectedly early delivery and still have not told their 40 year-old daughter, Festus Holts, that he has been born, but plan to tell her in person when she visits today. They have good support and are in good spirits.  [place], [place] Pager, [phone] 12:48 PM    08/18/16 1200  Clinical Encounter Type  Visited With Patient and family together  Visit Type Spiritual support

## 2016-08-18 NOTE — Progress Notes (Signed)
CSW acknowledges NICU admission.    Patient screened out for psychosocial assessment since none of the following apply:  Psychosocial stressors documented in mother or baby's chart  Gestation less than 32 weeks  Code at delivery   Infant with anomalies  Please contact the Clinical Social Worker if specific needs arise, or by MOB's request.       

## 2016-08-18 NOTE — Progress Notes (Signed)
Patient is eating, ambulating, voiding.  Pain control is good.  Appropriate lochia.  +flatus.  Vitals:   08/17/16 1844 08/17/16 2038 08/18/16 0300 08/18/16 0556  BP:    (!) 112/57  Pulse:  64  90  Resp:  20  16  Temp: 99.5 F (37.5 C) 99.2 F (37.3 C) 98.7 F (37.1 C) 97.6 F (36.4 C)  TempSrc: Oral Oral Oral Oral  SpO2:      Weight:      Height:        Fundus firm Perineum without swelling. Inc: c/d/i Ext: no CT  Lab Results  Component Value Date   WBC 12.0 (H) 08/17/2016   HGB 9.1 (L) 08/17/2016   HCT 28.8 (L) 08/17/2016   MCV 81.6 08/17/2016   PLT 198 08/17/2016    --/--/AB POS (11/25 0706)  A/P Postop day #2 s/p R c/s Baby doing well in NICU Encouraged ambulation and simethicone   Routine care.   Allyn Kenner

## 2016-08-19 NOTE — Progress Notes (Addendum)
  Patient is eating, ambulating, voiding.  Pain control is good.  Vitals:   08/18/16 0300 08/18/16 0556 08/18/16 1856 08/19/16 0541  BP:  (!) 112/57 108/63 (!) 107/49  Pulse:  90 98 90  Resp:  16 18 18   Temp: 98.7 F (37.1 C) 97.6 F (36.4 C) 99.1 F (37.3 C) 98 F (36.7 C)  TempSrc: Oral Oral Axillary Oral  SpO2:      Weight:      Height:        lungs:   clear to auscultation cor:    RRR Abdomen:  soft, appropriate tenderness, incisions intact and without erythema or exudate ex:    no cords   Lab Results  Component Value Date   WBC 12.0 (H) 08/17/2016   HGB 9.1 (L) 08/17/2016   HCT 28.8 (L) 08/17/2016   MCV 81.6 08/17/2016   PLT 198 08/17/2016    --/--/AB POS (11/25 0706)/RI  A/P    Post operative day 3.  Routine post op and postpartum care.  Expect d/c routine- pt desires to stay 4th day if possible.  Percocet for pain control. Baby doing well in NICU.

## 2016-08-19 NOTE — Plan of Care (Signed)
Problem: Pain Management: Goal: General experience of comfort will improve and pain level will decrease Outcome: Progressing Pt continues to have incisional pain.  Medication and hot packs help relieve the pain.

## 2016-08-19 NOTE — Plan of Care (Signed)
Problem: Pain Managment: Goal: General experience of comfort will improve Outcome: Progressing Pt will continue pain medication regimen.  States would like pain under 5/10 on pain scale.

## 2016-08-19 NOTE — Clinical Social Work Maternal (Signed)
CLINICAL SOCIAL WORK MATERNAL/CHILD NOTE  Patient Details  Name: Heather Hicks MRN: [id number] Date of Birth: 05/21/1976  Date:  08/19/2016  Clinical Social Worker Initiating Note:  Halton Neas, LCSW Date/ Time Initiated:  08/18/16/1600     Child's Name:  Heather Hicks   Legal Guardian:  Other (Comment) (Parents: Charene and Bryan Falkenstein)   Need for Interpreter:  None   Date of Referral:   (No referral-NICU admission)     Reason for Referral:      Referral Source:      Address:  [address] [place], [place] [address]  Phone number:  [phone]   Household Members:  Minor Children, Spouse (Parents have one other child-daughter, Ella, age 3)   Natural Supports (not living in the home):  Immediate Family (Parents report having good supports.  Maternal grandparents were here with MOB today.)   Professional Supports: None   Employment:     Type of Work:     Education:      Financial Resources:  Private Insurance   Other Resources:      Cultural/Religious Considerations Which May Impact Care: None stated.  MOB's facesheet notes religion as Jewish.  Strengths:  Ability to meet basic needs , Compliance with medical plan , Pediatrician chosen , Understanding of illness, Home prepared for child    Risk Factors/Current Problems:  None   Cognitive State:  Alert , Able to Concentrate , Insightful , Linear Thinking , Goal Oriented    Mood/Affect:  Interested , Calm    CSW Assessment: CSW saw MOB in waiting area, who asked if there were any other bathrooms as the one for visitors was occupied and had been for quite some time.  CSW allowed MOB to go into the conference room to use the bathroom.  CSW introduced self and asked MOB who her baby is.  CSW explained that CSW had attempted to meet with her earlier, but she was in the shower.  CSW asked how she and baby are doing.  MOB seemed somewhat hesitant and said they are both doing well.  She then said,  "how long is he going to be here.  No one has told us anything."  CSW offered to speak with her about what to expect from a NICU admission, in general terms, without providing any specific medical updates on her baby, as this would need to come from an RN, NNP, or MD.  CSW offered to locate someone for her to speak with also.  She asked that CSW speak with her and asked if her parents and friend could join us.  CSW agreed and spoke about the milestones baby must meet in order to be ready for discharge.  CSW explained that baby will determine when he is ready for discharge and that there is no way to predict at this time.  CSW celebrated the milestones baby has already met, such as maintaining temperature and breathing on his own with family.  CSW encouraged family to focus on baby, rather than his surroundings and to not place expectations on how long his hospitalization will be as unmet expectations can lead to letdown.  Family was engaged, attentive and understanding.  They seemed very appreciative of the time spent with CSW.  CSW asked MOB to call any time they feel they would like to meet with the medical staff for a Family Conference. CSW provided contact information and explained ongoing support services offered by NICU CSW.  MOB reports no   further questions, concerns or needs at this time and thanked CSW.  CSW Plan/Description:  Patient/Family Education , Psychosocial Support and Ongoing Assessment of Needs    Arnaldo Heffron Elizabeth, LCSW 08/19/2016, 2:03 PM 

## 2016-08-19 NOTE — Lactation Note (Addendum)
This note was copied from a baby's chart. Lactation Consultation Note  Patient Name: Heather Hicks [id number] Date: 08/19/2016 Reason for consult: Follow-up assessment;NICU baby   Follow-up with NICU mom at 40 hrs old; Mom is being discharged today.  Mom is a P2 with experience breastfeeding first child.   GA 32.5; BW 6 lbs, 1 oz.  LS-6.  Mom was pumping when LC entered room.  Mom stated she had asked to see [facility] because she had questions.   Mom's milk is transitioning, breasts are full, slightly softened with pumping.  Reviewed hands-on pumping and hand expression at end of pumping session.   Reviewed engorgement prevention.   Reviewed hand expression with return demonstration.   Mom pumped 23 ml total with current pumping on "maintain" setting.  (Mom reports she has been using "maintain" setting during her stay). Mom has insurance and plans to call insurance company to obtain extra pump.  Mom reports she has a DEBP (Medela) at home from previous child (now 36 yrs old).  She plans to use that pump while she awaits her pump from insurance company.  2-week rental explained to parents as additional option if she decides she would like to use Symphony a while longer.   Reviewed the need to pump 8 times per day.  Suggested pumping every 2 hrs during the day and at least once during the night for a total of 8+ pumpings per day. Yellow dots given for tops of containers and several bottles given for use at home.  Reviewed cleaning pump parts.   Parents had lots of questions; Calypso answered questions.  Mom asked for comfort gels prior to [facility] leaving room.  C-gels given and explained use.  Mom is currently using coconut oil on nipples.   Encouraged to call NICU LC once baby is latching for scheduling assistance with feeding.     Maternal Data Has patient been taught Hand Expression?: Yes Does the patient have breastfeeding experience prior to this delivery?: Yes  Feeding    LATCH Score/Interventions     Intervention(s): Hand expression                 Lactation Tools Discussed/Used WIC Program: No   Consult Status Consult Status: PRN    Merlene Laughter 08/19/2016, 12:34 PM

## 2016-08-20 LAB — RPR: RPR Ser Ql: NONREACTIVE

## 2016-08-20 MED ORDER — OXYCODONE-ACETAMINOPHEN 5-325 MG PO TABS: 1.0000 | ORAL_TABLET | ORAL | 0 refills | Status: AC | PRN

## 2016-08-20 MED ORDER — IBUPROFEN 600 MG PO TABS: 600.0000 mg | ORAL_TABLET | Freq: Four times a day (QID) | ORAL | 2 refills | Status: AC | PRN

## 2016-08-20 NOTE — Lactation Note (Addendum)
This note was copied from a baby's chart. Lactation Consultation Note  Breasts are filling. Mom expressed 14 ml at this session. Adjusted flange size to a #21 and mom stated increased comfort.  Mom was still questioning which flange size to use. Explained that she should use the one with the best fit and that yielded the most milk. She was curious as to why her breast felt full after expression. Discussed fluid shifts and showed her therapeutic breast massage of lactation.   She reported more comfort after this.  Mom is also concerned about history of low MS. She perceives more milk with this lacation experience.  Suggested working on supply over the weekend and to communicate with lactation if milk does not come to volume.  Plans 2 week rental.  Patient Name: Heather Hicks [id number] Date: 08/20/2016 Reason for consult: Follow-up assessment   Maternal Data Has patient been taught Hand Expression?: Yes  Feeding Feeding Type: Donor Breast Milk Length of feed: 60 min  LATCH Score/Interventions                      Lactation Tools Discussed/Used     Consult Status Consult Status: PRN    Van Clines 08/20/2016, 9:57 AM

## 2016-08-20 NOTE — Progress Notes (Signed)
Subjective: Postpartum Day 4: Cesarean Delivery Patient reports tolerating PO, + flatus, + BM and no problems voiding.    Objective: Vital signs in last 24 hours: Temp:  [98.3 F (36.8 C)-99.2 F (37.3 C)] 98.3 F (36.8 C) (12/01 0624) Pulse Rate:  [75-102] 75 (12/01 0624) Resp:  [18] 18 (12/01 0624) BP: (106-112)/(44-45) 106/45 (12/01 [id number])  Physical Exam:  General: alert, cooperative and no distress Lochia: appropriate Uterine Fundus: firm Incision: healing well, no significant drainage, no dehiscence, no significant erythema DVT Evaluation: No evidence of DVT seen on physical exam. Negative Homan's sign. No cords or calf tenderness. Calf/Ankle edema is present.  No results for input(s): HGB, HCT in the last 72 hours.  Assessment/Plan: Status post Cesarean section. Doing well postoperatively.  Discharge home with standard precautions and return to clinic in 4-6 weeks.  Naod Sweetland, [place] 08/20/2016, 9:24 AM

## 2016-08-20 NOTE — Progress Notes (Signed)
Pt requesting pain medication to be brought every 4 hours. Educated pt regarding prn medications. Agreed to check on patient every 4 hours to assess pain.

## 2016-08-20 NOTE — Discharge Summary (Signed)
Obstetric Discharge Summary Reason for Admission: rupture of membranes Prenatal Procedures: NST and ultrasound Intrapartum Procedures: cesarean: low cervical, transverse Postpartum Procedures: none Complications-Operative and Postpartum: none Hemoglobin  Date Value Ref Range Status  08/17/2016 9.1 (L) 12.0 - 15.0 g/dL Final   HCT  Date Value Ref Range Status  08/17/2016 28.8 (L) 36.0 - 46.0 % Final    Physical Exam:  General: alert, cooperative and no distress Lochia: appropriate Uterine Fundus: firm Incision: healing well, no significant drainage, no dehiscence, no significant erythema DVT Evaluation: No evidence of DVT seen on physical exam. Negative Homan's sign. No cords or calf tenderness. Calf/Ankle edema is present.  Discharge Diagnoses: Premature labor  Discharge Information: Date: 08/20/2016 Activity: pelvic rest Diet: routine Medications: Ibuprofen and Percocet Condition: stable Instructions: refer to practice specific booklet Discharge to: home   Newborn Data: Live born female  Birth Weight: 6 lb 1.9 oz (2775 g) APGAR: 7, 9  In NICU  Hasset Chaviano, [place] 08/20/2016, 9:31 AM

## 2016-08-25 ENCOUNTER — Ambulatory Visit: Payer: Self-pay

## 2016-08-25 NOTE — Lactation Note (Signed)
This note was copied from a baby's chart. Lactation Consultation Note  Patient Name: Heather Hicks [id number] Date: 08/25/2016 Reason for consult: Follow-up assessment;NICU baby Randel Books is now CGA 34 weeks.  Mom states she is pumping every 2-4 hours and totals 6-8 pumpings/24 hours.  Mom is concerned about her milk supply.  She had a low supply with her first baby and had to supplement entire time.  Mom denies breast changes during pregnancy.  She is using a symphony pump and obtains 10-15 mls per breast.  Recommended getting in at least 8 pumpings/24 hours.  Discussed fenugreek and Mother's milk plus supplements which may increase volume produced. Assisted with latching baby to breast using football hold.  Baby latched easily with good breast compression and nursed actively for 15 minutes.  Mom using good breast massage during feeding.  Mom pleased with feeding.  Discussed normal preterm feeding behavior and reassured that feedings should become more consistent and effective as baby reaches term.  Encouraged to call with concerns/assist prn.  Maternal Data    Feeding Feeding Type: Breast Fed Length of feed: 15 min  LATCH Score/Interventions Latch: Grasps breast easily, tongue down, lips flanged, rhythmical sucking. Intervention(s): Waking techniques  Audible Swallowing: A few with stimulation Intervention(s): Hand expression;Alternate breast massage  Type of Nipple: Everted at rest and after stimulation  Comfort (Breast/Nipple): Soft / non-tender     Hold (Positioning): Assistance needed to correctly position infant at breast and maintain latch. Intervention(s): Breastfeeding basics reviewed;Support Pillows;Position options  LATCH Score: 8  Lactation Tools Discussed/Used     Consult Status Consult Status: PRN    Ave Filter 08/25/2016, 2:25 PM

## 2016-08-26 ENCOUNTER — Ambulatory Visit: Payer: Self-pay

## 2016-08-26 DIAGNOSIS — L0390 Cellulitis, unspecified: Secondary | ICD-10-CM | POA: Diagnosis not present

## 2016-08-26 NOTE — Lactation Note (Addendum)
This note was copied from a baby's chart. Lactation Consultation Note  Patient Name: Boy Geraldene Holk [id number] Date: 08/26/2016 Reason for consult: Initial assessment;NICU baby  NICU baby 39 days old. Mom states that the baby is still sleepy and not wanting to latch. Discussed normal infant behavior. Mom reports that she is definitely getting more EBM when she pumps after having the baby at the breast.   Maternal Data    Feeding Feeding Type: Breast Fed Length of feed: 0 min  LATCH Score/Interventions Latch: Too sleepy or reluctant, no latch achieved, no sucking elicited. Intervention(s): Skin to skin  Audible Swallowing: None Intervention(s): Skin to skin;Hand expression  Type of Nipple: Everted at rest and after stimulation  Comfort (Breast/Nipple): Soft / non-tender     Hold (Positioning): No assistance needed to correctly position infant at breast.  LATCH Score: 6  Lactation Tools Discussed/Used     Consult Status Consult Status: PRN    Andres Labrum 08/26/2016, 2:21 PM

## 2016-08-26 NOTE — Lactation Note (Signed)
This note was copied from a baby's chart. Lactation Consultation Note  Patient Name: Boy Deyanira Lins [id number] Date: 08/26/2016 Reason for consult: Follow-up assessment;NICU baby  NICU baby 34 days old. Called to NICU to assist mom with latching baby. Mom reports that the baby nursed well for 15 minutes yesterday, and this was the first time the baby was at the breast and the first day the baby had a bottle. Mom reports that the baby had a bottle 3 hours earlier, and seems really sleepy now. Assisted mom with football position and waking techniques, but baby would no attempt to latch or suckle this LC's gloved finger. Assisted mom to place baby on her chest STS and enc mom to hold the baby while he is tube-fed for this feeding. Mom states that she will attempt to latch the baby at the next feeding.   Maternal Data    Feeding Feeding Type: Breast Fed Length of feed: 0 min  LATCH Score/Interventions Latch: Too sleepy or reluctant, no latch achieved, no sucking elicited. Intervention(s): Skin to skin;Waking techniques  Audible Swallowing: None Intervention(s): Skin to skin;Hand expression  Type of Nipple: Everted at rest and after stimulation  Comfort (Breast/Nipple): Soft / non-tender     Hold (Positioning): Assistance needed to correctly position infant at breast and maintain latch.  LATCH Score: 5  Lactation Tools Discussed/Used     Consult Status Consult Status: PRN    Andres Labrum 08/26/2016, 11:08 AM

## 2016-08-27 ENCOUNTER — Ambulatory Visit: Payer: Self-pay

## 2016-08-27 DIAGNOSIS — T888XXA Other specified complications of surgical and medical care, not elsewhere classified, initial encounter: Secondary | ICD-10-CM | POA: Diagnosis not present

## 2016-08-27 DIAGNOSIS — R300 Dysuria: Secondary | ICD-10-CM | POA: Diagnosis not present

## 2016-08-27 NOTE — Lactation Note (Signed)
This note was copied from a baby's chart. Lactation Consultation Note  Patient Name: Heather Hicks [id number] Date: 08/27/2016 Reason for consult: Follow-up assessment;NICU baby  NICU baby 93 days old. Mom reports that she was diagnosed yesterday with Cellulitis in her c/s incision. Mom states that she had pockets of fluid drained in her incision this morning--08/27/16. Mom questioning if the Cephalexin antibiotic that her [facility] provider prescribed is compatible with breastfeeding. Confirmed compatibility using [employer], "Medications and Mothers' Milk"--L1 (Safest).  Assisted mom with latching baby in football position to right breast. Enc mom to "teacup" the breast and continue to hold the breast until the baby pulls the breast tissue deeply into his mouth--mom lets go and the baby cannot maintain a deep latch. Baby was able to latch deeply and suckle in bursts with lips flanged for about 3 minutes. Then, baby sleepy at the breast and could not be stimulated to suckle anymore. Enc mom to continue holding baby STS while baby being fed by feeding tube. Mom stated that she is continuing to see increasing amounts of EBM after baby at breast.   Maternal Data    Feeding Feeding Type: Breast Fed Nipple Type: Slow - flow Length of feed: 3 min  LATCH Score/Interventions Latch: Repeated attempts needed to sustain latch, nipple held in mouth throughout feeding, stimulation needed to elicit sucking reflex. Intervention(s): Skin to skin;Waking techniques Intervention(s): Adjust position;Assist with latch;Breast compression  Audible Swallowing: A few with stimulation Intervention(s): Skin to skin;Hand expression  Type of Nipple: Everted at rest and after stimulation  Comfort (Breast/Nipple): Soft / non-tender     Hold (Positioning): Assistance needed to correctly position infant at breast and maintain latch. Intervention(s): Breastfeeding basics reviewed;Support Pillows;Skin to skin  LATCH Score:  7  Lactation Tools Discussed/Used     Consult Status Consult Status: PRN    Andres Labrum 08/27/2016, 2:06 PM

## 2016-08-29 ENCOUNTER — Encounter (HOSPITAL_COMMUNITY): Payer: Self-pay

## 2016-08-29 ENCOUNTER — Inpatient Hospital Stay (HOSPITAL_COMMUNITY)
Admission: AD | Admit: 2016-08-29 | Discharge: 2016-08-31 | DRG: 769 | Disposition: A | Discharge status: Home / Self Care | Payer: BLUE CROSS/BLUE SHIELD | Source: Ambulatory Visit | Attending: Obstetrics | Admitting: Obstetrics

## 2016-08-29 DIAGNOSIS — T8149XA Infection following a procedure, other surgical site, initial encounter: Secondary | ICD-10-CM | POA: Diagnosis present

## 2016-08-29 DIAGNOSIS — L0390 Cellulitis, unspecified: Secondary | ICD-10-CM | POA: Diagnosis not present

## 2016-08-29 DIAGNOSIS — Z880 Allergy status to penicillin: Secondary | ICD-10-CM | POA: Diagnosis not present

## 2016-08-29 DIAGNOSIS — T8189XA Other complications of procedures, not elsewhere classified, initial encounter: Secondary | ICD-10-CM | POA: Diagnosis not present

## 2016-08-29 DIAGNOSIS — O902 Hematoma of obstetric wound: Secondary | ICD-10-CM | POA: Diagnosis present

## 2016-08-29 DIAGNOSIS — L03311 Cellulitis of abdominal wall: Secondary | ICD-10-CM | POA: Diagnosis not present

## 2016-08-29 DIAGNOSIS — O860 Infection of obstetric surgical wound: Secondary | ICD-10-CM | POA: Diagnosis present

## 2016-08-29 DIAGNOSIS — O9089 Other complications of the puerperium, not elsewhere classified: Secondary | ICD-10-CM | POA: Diagnosis present

## 2016-08-29 LAB — URINALYSIS, ROUTINE W REFLEX MICROSCOPIC
BILIRUBIN URINE: NEGATIVE
Glucose, UA: NEGATIVE mg/dL
KETONES UR: NEGATIVE mg/dL
Nitrite: NEGATIVE
PROTEIN: 30 mg/dL — AB
SPECIFIC GRAVITY, URINE: 1.013 (ref 1.005–1.030)
pH: 5 (ref 5.0–8.0)

## 2016-08-29 LAB — BASIC METABOLIC PANEL
Anion gap: 7 (ref 5–15)
BUN: 13 mg/dL (ref 6–20)
CHLORIDE: 103 mmol/L (ref 101–111)
CO2: 27 mmol/L (ref 22–32)
CREATININE: 0.69 mg/dL (ref 0.44–1.00)
Calcium: 8.8 mg/dL — ABNORMAL LOW (ref 8.9–10.3)
Glucose, Bld: 131 mg/dL — ABNORMAL HIGH (ref 65–99)
POTASSIUM: 4.3 mmol/L (ref 3.5–5.1)
SODIUM: 137 mmol/L (ref 135–145)

## 2016-08-29 LAB — CBC WITH DIFFERENTIAL/PLATELET
BASOS PCT: 0 %
Basophils Absolute: 0 10*3/uL (ref 0.0–0.1)
EOS ABS: 0.2 10*3/uL (ref 0.0–0.7)
Eosinophils Relative: 2 %
HCT: 27.9 % — ABNORMAL LOW (ref 36.0–46.0)
HEMOGLOBIN: 8.9 g/dL — AB (ref 12.0–15.0)
Lymphocytes Relative: 23 %
Lymphs Abs: 2.1 10*3/uL (ref 0.7–4.0)
MCH: 25.7 pg — ABNORMAL LOW (ref 26.0–34.0)
MCHC: 31.9 g/dL (ref 30.0–36.0)
MCV: 80.6 fL (ref 78.0–100.0)
Monocytes Absolute: 0.3 10*3/uL (ref 0.1–1.0)
Monocytes Relative: 3 %
NEUTROS PCT: 72 %
Neutro Abs: 6.7 10*3/uL (ref 1.7–7.7)
PLATELETS: 415 10*3/uL — AB (ref 150–400)
RBC: 3.46 MIL/uL — AB (ref 3.87–5.11)
RDW: 13.9 % (ref 11.5–15.5)
WBC: 9.3 10*3/uL (ref 4.0–10.5)

## 2016-08-29 MED ORDER — OXYCODONE HCL 5 MG PO TABS
5.0000 mg | ORAL_TABLET | ORAL | Status: DC | PRN
  Administered 2016-08-30: 5 mg via ORAL
  Filled 2016-08-29: qty 1

## 2016-08-29 MED ORDER — OXYCODONE-ACETAMINOPHEN 5-325 MG PO TABS
2.0000 | ORAL_TABLET | ORAL | Status: DC | PRN
  Administered 2016-08-29 – 2016-08-30 (×5): 2 via ORAL
  Filled 2016-08-29 (×5): qty 2

## 2016-08-29 MED ORDER — SIMETHICONE 80 MG PO CHEW
80.0000 mg | CHEWABLE_TABLET | Freq: Three times a day (TID) | ORAL | Status: DC
  Administered 2016-08-29 – 2016-08-31 (×6): 80 mg via ORAL
  Filled 2016-08-29 (×6): qty 1

## 2016-08-29 MED ORDER — ACETAMINOPHEN 325 MG PO TABS: 650.0000 mg | ORAL_TABLET | ORAL | Status: DC | PRN

## 2016-08-29 MED ORDER — SIMETHICONE 80 MG PO CHEW
80.0000 mg | CHEWABLE_TABLET | ORAL | Status: DC
  Administered 2016-08-31: 80 mg via ORAL
  Filled 2016-08-29 (×2): qty 1

## 2016-08-29 MED ORDER — MENTHOL 3 MG MT LOZG: 1.0000 | LOZENGE | OROMUCOSAL | Status: DC | PRN

## 2016-08-29 MED ORDER — DIPHENHYDRAMINE HCL 25 MG PO CAPS: 25.0000 mg | ORAL_CAPSULE | Freq: Four times a day (QID) | ORAL | Status: DC | PRN

## 2016-08-29 MED ORDER — COCONUT OIL OIL: 1.0000 "application " | TOPICAL_OIL | Status: DC | PRN

## 2016-08-29 MED ORDER — PANTOPRAZOLE SODIUM 40 MG PO TBEC
40.0000 mg | DELAYED_RELEASE_TABLET | Freq: Every day | ORAL | Status: DC
  Administered 2016-08-29 – 2016-08-31 (×3): 40 mg via ORAL
  Filled 2016-08-29 (×3): qty 1

## 2016-08-29 MED ORDER — VANCOMYCIN HCL IN DEXTROSE 1-5 GM/200ML-% IV SOLN
1000.0000 mg | Freq: Three times a day (TID) | INTRAVENOUS | Status: DC
  Administered 2016-08-29 – 2016-08-31 (×5): 1000 mg via INTRAVENOUS
  Filled 2016-08-29 (×6): qty 200

## 2016-08-29 MED ORDER — DIBUCAINE 1 % RE OINT: 1.0000 "application " | TOPICAL_OINTMENT | RECTAL | Status: DC | PRN

## 2016-08-29 MED ORDER — OXYCODONE-ACETAMINOPHEN 5-325 MG PO TABS
1.0000 | ORAL_TABLET | ORAL | Status: DC | PRN
  Administered 2016-08-30 – 2016-08-31 (×7): 1 via ORAL
  Filled 2016-08-29 (×7): qty 1

## 2016-08-29 MED ORDER — FENTANYL CITRATE (PF) 100 MCG/2ML IJ SOLN
100.0000 ug | Freq: Once | INTRAMUSCULAR | Status: AC
  Administered 2016-08-29: 100 ug via INTRAVENOUS
  Filled 2016-08-29: qty 2

## 2016-08-29 MED ORDER — PRENATAL MULTIVITAMIN CH
1.0000 | ORAL_TABLET | Freq: Every day | ORAL | Status: DC
  Administered 2016-08-30 – 2016-08-31 (×2): 1 via ORAL
  Filled 2016-08-29 (×2): qty 1

## 2016-08-29 MED ORDER — IBUPROFEN 600 MG PO TABS
600.0000 mg | ORAL_TABLET | Freq: Four times a day (QID) | ORAL | Status: DC
  Administered 2016-08-29 – 2016-08-31 (×8): 600 mg via ORAL
  Filled 2016-08-29 (×8): qty 1

## 2016-08-29 MED ORDER — POLYETHYLENE GLYCOL 3350 17 G PO PACK
17.0000 g | PACK | Freq: Every day | ORAL | Status: DC
  Administered 2016-08-29 – 2016-08-31 (×3): 17 g via ORAL
  Filled 2016-08-29 (×3): qty 1

## 2016-08-29 MED ORDER — SIMETHICONE 80 MG PO CHEW: 80.0000 mg | CHEWABLE_TABLET | ORAL | Status: DC | PRN

## 2016-08-29 MED ORDER — WITCH HAZEL-GLYCERIN EX PADS: 1.0000 "application " | MEDICATED_PAD | CUTANEOUS | Status: DC | PRN

## 2016-08-29 NOTE — Progress Notes (Signed)
Pharmacy Antibiotic Note  Heather Hicks is a 40 y.o. female admitted on 08/29/2016 with worsening cellulitis of the lower abdomen and pannus s/p c-section and subsequent seroma which was drained 12/8 in the office and has been draining at home today.  She was taking po keflex prior to admission.  Pharmacy has been consulted for vancomycin dosing.  Plan: - Vancomycin 1g IV q8h. Goal ~15 mcg/mL  - Follow up SCr, UOP, cultures, clinical course and adjust as clinically indicated.   Height: 5\' 8"  (172.7 cm) Weight: 253 lb (114.8 kg) IBW/kg (Calculated) : 63.9  Temp (24hrs), Avg:97.9 F (36.6 C), Min:97.8 F (36.6 C), Max:98 F (36.7 C)   Recent Labs Lab 08/29/16 1537  WBC 9.3  CREATININE 0.69    Estimated Creatinine Clearance: 124.4 mL/min (by C-G formula based on SCr of 0.69 mg/dL).    Allergies  Allergen Reactions  . Shellfish Allergy Other (See Comments)    Eyes swell shut  . Penicillins Rash    Has patient had a PCN reaction causing immediate rash, facial/tongue/throat swelling, SOB or lightheadedness with hypotension: Yes Has patient had a PCN reaction causing severe rash involving mucus membranes or skin necrosis: No Has patient had a PCN reaction that required hospitalization No Has patient had a PCN reaction occurring within the last 10 years: No If all of the above answers are "NO", then may proceed with Cephalosporin use.     Antimicrobials this admission: Vancomycin  12/10 >>    Dose adjustments this admission:   Microbiology results: 12/10 UCx: pending    Thank you for allowing pharmacy to be a part of this patient's care.  Vonda Antigua 08/29/2016 6:32 PM

## 2016-08-29 NOTE — MAU Note (Signed)
Still having redness on her incision. Yesterday when she unpacked wound, she didn't seem to find a place to repack. Some yellow/red drainage. Called Dr Carlis Abbott and she advised her to come to MAU. Is taking her antibiotic and pain medicine.

## 2016-08-29 NOTE — Progress Notes (Addendum)
Assumed care of pt after receiving report from previous shift nurse. Pt comfortable at this time. Waiting on Vancomycin abx from pharmacy. Pt fully aware of POC. See flow sheet for VS and assessment.   2000: Pt up to bathroom. ABD pad noted scant serosang drainage. MD made aware. MD will be doing the Iodofom dressing change A.M.   2030: pt to NICU to visit infant.   2145: back from NICU  2200: Motrin administered. 2nd ABD pad changed and noted scant serosang drainage.   2330: B. Pumped. Milk w label on and put in appropriate refrigerator.  0400: 2nd bag Vanco up and medicated pt for incisional pain with 2 percocet.

## 2016-08-29 NOTE — H&P (Signed)
40 y.o. [id number] POD#13 s/p RCS at 32 weeks for PPROM presents for evaluation of cesarean wound complications.  She was seen in the office on 12/7 and diagnosed with a cellulitis of the lower abdomen and pannus.   She was started on keflex.  At that time, the incision was intact, without erythema or drainage.  She represented to the office on 12/8 with complaints of significant drainage of the incision.  She was diagnosed with a seroma.  The incision was opened in two areas with serosanguinous drainage.  No e/o infection.  It was packed.  She removed the packing yesterday morning and reports that her mother was unable to "find the openings" to repack. She had no drainage yesterday during the day, but woke up with significant drainage this morning.  She reports cellulitis has not worsened, but also not improved.   Denies fevers, chills, nausea, vomiting.      Past Medical History:  Diagnosis Date  . Melanoma ([place]) 2008   neck  . Migraine     Past Surgical History:  Procedure Laterality Date  . CESAREAN SECTION N/A 05/22/2013   Procedure: Primary CESAREAN SECTION with Delivery Baby Girl @ 2230, Apgars 8/9;  Surgeon: Daria Pastures, MD;  Location: [place] [facility];  Service: Obstetrics;  Laterality: N/A;  . CESAREAN SECTION N/A 08/16/2016   Procedure: REPEAT CESAREAN SECTION;  Surgeon: Jerelyn Charles, MD;  Location: [place];  Service: Obstetrics;  Laterality: N/A;  REQUESTING RNFA  PLEASE  . DILATION AND CURETTAGE OF UTERUS N/A 09/12/2014   Procedure: DILATATION AND CURETTAGE;  Surgeon: Farrel Gobble. Harrington Challenger, MD;  Location: [facility];  Service: Gynecology;  Laterality: N/A;  . DILATION AND EVACUATION N/A 09/12/2014   Procedure: DILATATION AND EVACUATION;  Surgeon: Farrel Gobble. Harrington Challenger, MD;  Location: [facility];  Service: Gynecology;  Laterality: N/A;  . GALLBLADDER SURGERY  2009  . melanoma  2008   removed from neck  . TONSILLECTOMY  1989    OB History  Gravida Para Term Preterm AB Living  4 2 1 1 2 2   SAB  TAB Ectopic Multiple Live Births  1 1 0 0 2    # Outcome Date GA Lbr Len/2nd Weight Sex Delivery Anes PTL Lv  4 Preterm 08/16/16 [redacted]w[redacted]d  6 lb 1.9 oz (2.775 kg) M CS-LTranv Spinal  LIV  3 Term 05/22/13 [redacted]w[redacted]d  7 lb 10.6 oz (3.476 kg) F CS-LTranv EPI  LIV  2 TAB           1 SAB               Social History   Social History  . Marital status: Married    Spouse name: N/A  . Number of children: N/A  . Years of education: N/A   Occupational History  . Not on file.   Social History Main Topics  . Smoking status: Never Smoker  . Smokeless tobacco: Never Used  . Alcohol use No  . Drug use: No  . Sexual activity: Yes    Birth control/ protection: None   Other Topics Concern  . Not on file   Social History Narrative  . No narrative on file   Shellfish allergy and Penicillins        Vitals:   08/29/16 1445  BP: 128/81  Pulse: 87  Resp: 16  Temp: 97.8 F (36.6 C)     General:  NAD Abdomen: worsening cellulitis on pannus / lower abdomen. Continued edema of same area.  Incision still without erythema or induration.  Areas that were previously opened were probed again w qtip.  The area in the midline had a significant quick drainage of straw colored serous fluid.   Procedure:  After consent was obtained, she received premedication with fentanyl 125mcg.  The incision was infiltrated with 10 mL of 1% lidocaine without epinephrine.  The seroma extended to the right aspect of the incision.   The wound was further opened to the right.    A scalpel was used to connect the two areas that were previously opened.  The subcutaneous tissue was opened to the fascia. The fascia was thoroughly probed and was in tact. The wound was irrigated with normal saline and packed with iodoform gauze.    Lab Results  Component Value Date   WBC 9.3 08/29/2016   HGB 8.9 (L) 08/29/2016   HCT 27.9 (L) 08/29/2016   MCV 80.6 08/29/2016   PLT 415 (H) 08/29/2016       A/P   40 y.o. [id number] POD#13  with wound seroma and cellulitis not responding to outpatient management Admit Cellulitis: IV antibiotics--pcn allergy.  Want coverage for MRSA.  Is breastfeeding.  D/w pharmacy--will start vancomycin and readdress tomorrow.    Wound seroma--no evidence of wound infection.  BID dressing changes Breast pump to room    [place]

## 2016-08-30 ENCOUNTER — Encounter (HOSPITAL_COMMUNITY): Payer: Self-pay

## 2016-08-30 LAB — URINE CULTURE: Culture: NO GROWTH

## 2016-08-30 NOTE — Progress Notes (Signed)
Inquired with lab about Vanc draw.

## 2016-08-30 NOTE — Care Management Note (Addendum)
Case Management Note  Patient Details  Name: MIYAH HEINERT MRN: [id number] Date of Birth: 12/26/1975  Subjective/Objective:               Wound infection/cellulitis     Action/Plan:   Wound VAC with [facility] and Home Health with [place]   Expected Discharge Date:  08/31/16               Expected Discharge Plan:  [place]   Discharge planning Services  CM Consult    Choice offered to:  Patient  DME Arranged:  Vac DME Agency:  [facility]  HH Arranged:  RN [place] Agency:   [facility]  Status of Service:  Completed, signed off 08/31/16- CM on unit and in room with patient.  Notified patient that CM spoke to Rickie T. With [facility] and she stated that wound VAC should be delivered to [facility] room # 305 around 1;00 pm today.  CM in communication with Sharion Settler WOC RN and she stated she will come by after 1pm today and place home [facility] wound vac on patient for patient then to be discharged home.  CM called Pietro Cassis liason with [facility] on phone and gave her information above and she stated that [facility] will be in contact with patient and will make 1st visit either Thursday/or Friday.  Patient has phone numbers of CM, [facility] and [place].  Patient verbalized understanding and questions answered.      Additional Comments:  CM on unit with Dr. Mignon Pine and discussed plan for patient for at discharge.  Orders given for home wound vac/[facility] and HH RN.  [facility] plan for discharge is for tomorrow 08/31/16 Tuesday.  CM spoke to patient in room and offered choice/list and patient had no preference of [place] agency.  Referral sent to [facility] [phone] - Pietro Cassis and demographics reviewed with patient and they are correct in epic. HH to start at home after discharge.  Dressing changes will be 3x week.  VAC referred to [facility] [phone] with [facility].  Signature from Dr. Harrington Challenger obtained and faxed to [facility] and awaiting approval and delivery time from [place].  CM spoke to Candie Mile  with [place] RN at [facility] and she or another WOC RN will come over to [facility] and place home VAC from [facility] on patient after [facility] has been delivered to [facility] prior to discharge.   Yong Channel, RN 08/30/2016, 4:02 PM

## 2016-08-30 NOTE — Lactation Note (Signed)
This note was copied from a baby's chart. Lactation Consultation Note  Patient Name: Heather Hicks [id number] Date: 08/30/2016 Reason for consult: Follow-up assessment;NICU baby   Spoke with mom of [facility] unit. She reports she is pumping and taking milk to infant in NICU. She reports she does not have any LC needs at this time. She reports she would like for infant to latch and has been trying. Follow up as needed.    Maternal Data    Feeding    LATCH Score/Interventions                      Lactation Tools Discussed/Used     Consult Status Consult Status: PRN Follow-up type: Call as needed    Donn Pierini 08/30/2016, 8:56 AM

## 2016-08-30 NOTE — Progress Notes (Addendum)
Patient ID: Heather Hicks, female   DOB: 05-30-1976, 40 y.o.   MRN: [id number]   HD#2 w/ Abdominal cellulitis POD #14 S/P Repeat C/S for PPROM @ 32+5.   S: Pain is controlled with oral pain medication. Feeling better than yesterday. Fells redness has improved. O:  Vitals:   08/30/16 0420 08/30/16 0815  BP: (!) 123/52 (!) 110/51  Pulse: 85 72  Resp: 18 18  Temp: 98.9 F (37.2 C) 97.5 F (36.4 C)    AOX3 Abd soft, interval decrease in redness of abdomen Incision packing removed, W to D debridement performed, and incision repacked. Persistent edema of pannus, but interval improvement. Defect measures 7 cm across, 1 cm wide, and 1.5-2 cm deep  A/P 1) Continue IV vanc for at least 1 additional day. When oral antibiotic transition appropriate will transition to either doxycycline or Bactrim  2) Will need home health to involved for incision packing.

## 2016-08-31 MED ORDER — OXYCODONE HCL 5 MG PO TABS: 5.0000 mg | ORAL_TABLET | ORAL | 0 refills | Status: AC | PRN

## 2016-08-31 MED ORDER — SULFAMETHOXAZOLE-TRIMETHOPRIM 800-160 MG PO TABS
2.0000 | ORAL_TABLET | Freq: Two times a day (BID) | ORAL | Status: DC
  Administered 2016-08-31: 2 via ORAL
  Filled 2016-08-31 (×3): qty 2

## 2016-08-31 MED ORDER — SULFAMETHOXAZOLE-TRIMETHOPRIM 800-160 MG PO TABS: 2.0000 | ORAL_TABLET | Freq: Two times a day (BID) | ORAL | 0 refills | Status: AC

## 2016-08-31 NOTE — Care Management (Signed)
Wound Vac delivered to [facility] room # 305 by [facility].  [place] RN notified and will come to [facility] and apply to patient prior to discharge.

## 2016-08-31 NOTE — Progress Notes (Signed)
Patient has received one oxy prior to discharge.  Patient has been updated and educated on discharge instructions, question and answer session has occurred. IV removed, wound vac secured to patient and uncomplicated.   All personal belongings removed from room, patient is to be discharged to home via private vehicle with spouse.  Wound Care and [place] have been secured prior to discharge.

## 2016-08-31 NOTE — Consult Note (Signed)
[facility] Nurse wound consult note Reason for Consult: placement of NPWT VAC dressing  Wound type:post C-section seroma  Measurement:1cm x 6cm x 6.5cm tunnel at 8 o'clock  Wound [id number] red, subcutaneous tissue Drainage (amount, consistency, odor) serosanguinous  Periwound: intact, indurated slightly lower pannus Dressing procedure/placement/frequency: 1pc of black foam used to fill the wound bed with long tail to pack into the tunneled area.  May want HHRN to consider use of white foam.  I will notify [facility] of need.  Hydrocolloid used at each end of the open wound to assist with seal of the of the VAC dressing.    Drape applied and sealed at 161mmHG.  Patient tolerated well.  Explained use of VAC machine and canister application to the machine.  HHRN to follow up for dressing changes T/Th/Sat  Discussed POC with patient and bedside nurse.  Re consult if needed, will not follow at this time. Thanks  Nyeema Want [employer], RN,CWOCN, CNS [phone])

## 2016-08-31 NOTE — Discharge Summary (Signed)
Physician Discharge Summary  Patient ID: Heather Hicks MRN: [id number] DOB/AGE: 04-14-1976 40 y.o.  Admit date: 08/29/2016 Discharge date: 08/31/2016  Admission Diagnoses:Wound seroma and cellulitis.    Discharge Diagnoses:  Active Problems:   Postoperative wound cellulitis   Discharged Condition: good  Hospital Course: Pt admitted to hospital after initial treatment of wound seroma and cellulitis failed outpatient treatment.  Pt started on Vancomycin for hx of pcn allergy.  Wound had BID dressing changes.  Home health consulted for wound vac at home.  Pt changed to Bactrim on HD #2 after 2 day course of vanc.  Pt remained afebrile.   Consults: home health  Significant Diagnostic Studies: Pt did not have an elevated wbc on admission.    Treatments: antibiotics: vancomycin  Discharge Exam: Blood pressure (!) 122/56, pulse 65, temperature 97.5 F (36.4 C), temperature source Oral, resp. rate 18, height 5\' 8"  (1.727 m), weight 114.8 kg (253 lb), SpO2 95 %, unknown if currently breastfeeding.   Disposition: 01-Home or Self Care     Medication List    STOP taking these medications   cephALEXin 500 MG capsule Commonly known as:  KEFLEX     TAKE these medications   ibuprofen 600 MG tablet Commonly known as:  ADVIL,MOTRIN Take 1 tablet (600 mg total) by mouth every 6 (six) hours as needed for moderate pain.   omeprazole 20 MG tablet Commonly known as:  PRILOSEC OTC Take 20 mg by mouth daily.   oxyCODONE 5 MG immediate release tablet Commonly known as:  Oxy IR/ROXICODONE Take 1 tablet (5 mg total) by mouth every 4 (four) hours as needed for breakthrough pain.   oxyCODONE-acetaminophen 5-325 MG tablet Commonly known as:  ROXICET Take 1-2 tablets by mouth every 4 (four) hours as needed for severe pain.   sulfamethoxazole-trimethoprim 800-160 MG tablet Commonly known as:  BACTRIM DS,SEPTRA DS Take 2 tablets by mouth every 12 (twelve) hours.            Durable  Medical Equipment        Start     Ordered   08/30/16 1112  For home use only DME Negative pressure wound device  Once    Comments:  Wound Vac for [facility]  Question Answer Comment  Frequency of dressing change 3 times per week   Length of need 3 Months   Dressing type Foam   Amount of suction 125 mm/Hg   Pressure application Continuous pressure   Supplies 10 canisters and 15 dressings per month for duration of therapy      08/30/16 1113       Signed: Ariya Bohannon A 08/31/2016, 8:39 AM

## 2016-08-31 NOTE — Progress Notes (Signed)
  Patient is eating, ambulating, voiding.  Pain control is good.  Vitals:   08/30/16 1555 08/30/16 2000 08/31/16 0000 08/31/16 0418  BP: 127/67 (!) 117/56 115/67 (!) 110/48  Pulse: 75 79 75 70  Resp: 17 20 17 18   Temp: 98.6 F (37 C) 98.4 F (36.9 C) 98.3 F (36.8 C) 98.2 F (36.8 C)  TempSrc: Oral Oral Oral Oral  SpO2: 100% 98% 96% 96%  Weight:      Height:        lungs: no acute  cor:    RRR Abdomen:  soft, appropriate tenderness.  Around incision still has some warmth and pink but seems better to pt than yesterday.  Incision itself with healthy gran tissue and no pus or exudates.  Incision was repacked with iodoform nugauze and saline.   ex:   SCD on.   Lab Results  Component Value Date   WBC 9.3 08/29/2016   HGB 8.9 (L) 08/29/2016   HCT 27.9 (L) 08/29/2016   MCV 80.6 08/29/2016   PLT 415 (H) 08/29/2016      A/P    Post operative day 15 from C/S with acute wound infection.  Routine post op and postpartum care.  Expect d/c today with home health for packing and wound vac.  Percocet for pain control. Will change pt to Bactrim today po and continue for 14 day course.

## 2016-09-01 DIAGNOSIS — T8189XA Other complications of procedures, not elsewhere classified, initial encounter: Secondary | ICD-10-CM | POA: Diagnosis not present

## 2016-09-01 DIAGNOSIS — Z79891 Long term (current) use of opiate analgesic: Secondary | ICD-10-CM | POA: Diagnosis not present

## 2016-09-01 DIAGNOSIS — L03311 Cellulitis of abdominal wall: Secondary | ICD-10-CM | POA: Diagnosis not present

## 2016-09-01 DIAGNOSIS — N99843 Postprocedural seroma of a genitourinary system organ or structure following other procedure: Secondary | ICD-10-CM | POA: Diagnosis not present

## 2016-09-02 ENCOUNTER — Ambulatory Visit: Payer: Self-pay

## 2016-09-02 NOTE — Lactation Note (Signed)
This note was copied from a baby's chart. Lactation Consultation Note  Patient Name: Heather Hicks [id number] Date: 09/02/2016 Reason for consult: Follow-up assessment;NICU baby;Late preterm infant Baby is receiving all bottles now and 35.1 CGA. Mom is pumping 6-8 times/24 hours and obtaining 30 mls total.  Assisted with positioning baby in football hold skin to skin.  Baby not latching so a 20 mm nipple shield applied.  Baby latched easily and nursed actively for 10 minutes.  Shield full of milk when baby came off.  Reviewed late preterm behavior and importance of post pumping and supplementing.  Recommended mom makes a OP appointment closer to term for feeding assessment.  Maternal Data    Feeding Feeding Type: Breast Fed Nipple Type: Slow - flow Length of feed: 10 min  LATCH Score/Interventions Latch: Grasps breast easily, tongue down, lips flanged, rhythmical sucking. (with 20 mm niplle shield) Intervention(s): Skin to skin;Teach feeding cues;Waking techniques Intervention(s): Breast compression;Breast massage;Assist with latch;Adjust position  Audible Swallowing: A few with stimulation Intervention(s): Hand expression Intervention(s): Alternate breast massage  Type of Nipple: Everted at rest and after stimulation (short)  Comfort (Breast/Nipple): Soft / non-tender     Hold (Positioning): Assistance needed to correctly position infant at breast and maintain latch. Intervention(s): Breastfeeding basics reviewed;Support Pillows;Position options;Skin to skin  LATCH Score: 8  Lactation Tools Discussed/Used     Consult Status Consult Status: PRN    Ave Filter 09/02/2016, 12:18 PM

## 2016-09-03 DIAGNOSIS — N99843 Postprocedural seroma of a genitourinary system organ or structure following other procedure: Secondary | ICD-10-CM | POA: Diagnosis not present

## 2016-09-03 DIAGNOSIS — Z79891 Long term (current) use of opiate analgesic: Secondary | ICD-10-CM | POA: Diagnosis not present

## 2016-09-03 DIAGNOSIS — L03311 Cellulitis of abdominal wall: Secondary | ICD-10-CM | POA: Diagnosis not present

## 2016-09-03 DIAGNOSIS — T8189XA Other complications of procedures, not elsewhere classified, initial encounter: Secondary | ICD-10-CM | POA: Diagnosis not present

## 2016-09-04 DIAGNOSIS — L03311 Cellulitis of abdominal wall: Secondary | ICD-10-CM | POA: Diagnosis not present

## 2016-09-04 DIAGNOSIS — Z79891 Long term (current) use of opiate analgesic: Secondary | ICD-10-CM | POA: Diagnosis not present

## 2016-09-04 DIAGNOSIS — N99843 Postprocedural seroma of a genitourinary system organ or structure following other procedure: Secondary | ICD-10-CM | POA: Diagnosis not present

## 2016-09-04 DIAGNOSIS — T8189XA Other complications of procedures, not elsewhere classified, initial encounter: Secondary | ICD-10-CM | POA: Diagnosis not present

## 2016-09-06 DIAGNOSIS — L03311 Cellulitis of abdominal wall: Secondary | ICD-10-CM | POA: Diagnosis not present

## 2016-09-06 DIAGNOSIS — Z79891 Long term (current) use of opiate analgesic: Secondary | ICD-10-CM | POA: Diagnosis not present

## 2016-09-06 DIAGNOSIS — Z6836 Body mass index (BMI) 36.0-36.9, adult: Secondary | ICD-10-CM | POA: Diagnosis not present

## 2016-09-06 DIAGNOSIS — Z4801 Encounter for change or removal of surgical wound dressing: Secondary | ICD-10-CM | POA: Diagnosis not present

## 2016-09-06 DIAGNOSIS — T8189XA Other complications of procedures, not elsewhere classified, initial encounter: Secondary | ICD-10-CM | POA: Diagnosis not present

## 2016-09-06 DIAGNOSIS — N99843 Postprocedural seroma of a genitourinary system organ or structure following other procedure: Secondary | ICD-10-CM | POA: Diagnosis not present

## 2016-09-08 DIAGNOSIS — Z79891 Long term (current) use of opiate analgesic: Secondary | ICD-10-CM | POA: Diagnosis not present

## 2016-09-08 DIAGNOSIS — L03311 Cellulitis of abdominal wall: Secondary | ICD-10-CM | POA: Diagnosis not present

## 2016-09-08 DIAGNOSIS — N99843 Postprocedural seroma of a genitourinary system organ or structure following other procedure: Secondary | ICD-10-CM | POA: Diagnosis not present

## 2016-09-08 DIAGNOSIS — T8189XA Other complications of procedures, not elsewhere classified, initial encounter: Secondary | ICD-10-CM | POA: Diagnosis not present

## 2016-09-10 ENCOUNTER — Ambulatory Visit: Payer: Self-pay

## 2016-09-10 DIAGNOSIS — T8189XA Other complications of procedures, not elsewhere classified, initial encounter: Secondary | ICD-10-CM | POA: Diagnosis not present

## 2016-09-10 DIAGNOSIS — L03311 Cellulitis of abdominal wall: Secondary | ICD-10-CM | POA: Diagnosis not present

## 2016-09-10 DIAGNOSIS — Z79891 Long term (current) use of opiate analgesic: Secondary | ICD-10-CM | POA: Diagnosis not present

## 2016-09-10 DIAGNOSIS — N99843 Postprocedural seroma of a genitourinary system organ or structure following other procedure: Secondary | ICD-10-CM | POA: Diagnosis not present

## 2016-09-10 NOTE — Lactation Note (Signed)
This note was copied from a baby's chart. Lactation Consultation Note  Patient Name: Heather Hicks [id number] Date: 09/10/2016 Reason for consult: Follow-up assessment;NICU baby  NICU baby 41 weeks old. Mom at bedside to put baby to breast, given another #20 NS. Mom reports that baby will not latch without the shield. Enc attempting to latch directly to breast a few minutes after baby nurses with the shield. Mom states that she is only able to pump about 15 ml of EBM from each breast, and she had the same issue with her first child--69.25 years old. Enc mom to nurse on one side until baby no longer suckling and swallowing, then switch breasts. Enc mom that any amount of breast milk is of great value to the baby, especially during this time of year and for a preemie.   Maternal Data    Feeding Feeding Type: Breast Fed Length of feed: 30 min  LATCH Score/Interventions Latch: Grasps breast easily, tongue down, lips flanged, rhythmical sucking.  Audible Swallowing: Spontaneous and intermittent  Type of Nipple: Everted at rest and after stimulation Intervention(s): No intervention needed  Comfort (Breast/Nipple): Soft / non-tender     Hold (Positioning): No assistance needed to correctly position infant at breast. Intervention(s): Support Pillows  LATCH Score: 10  Lactation Tools Discussed/Used Tools: Nipple Shields Nipple shield size: 20   Consult Status Consult Status: PRN    Andres Labrum 09/10/2016, 3:16 PM

## 2016-09-14 ENCOUNTER — Ambulatory Visit: Payer: Self-pay

## 2016-09-14 DIAGNOSIS — T8189XA Other complications of procedures, not elsewhere classified, initial encounter: Secondary | ICD-10-CM | POA: Diagnosis not present

## 2016-09-14 DIAGNOSIS — L03311 Cellulitis of abdominal wall: Secondary | ICD-10-CM | POA: Diagnosis not present

## 2016-09-14 DIAGNOSIS — Z79891 Long term (current) use of opiate analgesic: Secondary | ICD-10-CM | POA: Diagnosis not present

## 2016-09-14 DIAGNOSIS — N99843 Postprocedural seroma of a genitourinary system organ or structure following other procedure: Secondary | ICD-10-CM | POA: Diagnosis not present

## 2016-09-14 NOTE — Lactation Note (Signed)
This note was copied from a baby's chart. Lactation Consultation Note  Patient Name: Heather Hicks [id number] Date: 09/14/2016 Reason for consult: Follow-up assessment   With this mom of a NICU baby, now 20 weeks old and 36 6/7 weeks CGA. Mom needed help applying nipple shield, and then was able to independently breastfeed the baby. He latches deeply with shield, and mom's breast has good movement, and latch with shield is comfortable.  Mom was just placing the shied over her nipple yesterday, with out the help of suction. With latch, she felt severe pain. The baby is being treated for reflux, bradycardia, as per parents. On exam of his mouth, his upper lip frenulum is thick and extends to the gum line. His tongue appears to have some extension restriction, posterior.  With a gloved finger, I did some suck training. He has a strong suckle, but was not able to move my finger toward his mouth.  Mom has had to use a nipple shield to latch her older child and this baby, despite evert nipples.I feel tongue restriction could be playing a part in mom needed a nipple shield, although most preterm babies fo better with nipple shield, and may be contributing to his reflux.  Mom and dad aware of my findings, and I made it clear that I could be wrong, but wanted them to know what I thought . I advised mom to make an io/p lactation appointment once baby is discharged to home.   Parents very receptive to teaching.    Maternal Data    Feeding Feeding Type: Breast Fed Length of feed:  (15 mionutes on left breast)  LATCH Score/Interventions Latch: Grasps breast easily, tongue down, lips flanged, rhythmical sucking. (with 20 nipple shield) Intervention(s): Adjust position;Assist with latch  Audible Swallowing: Spontaneous and intermittent  Type of Nipple: Everted at rest and after stimulation  Comfort (Breast/Nipple): Soft / non-tender     Hold (Positioning): Assistance needed to correctly position  infant at breast and maintain latch. Intervention(s): Breastfeeding basics reviewed;Support Pillows;Position options  LATCH Score: 9  Lactation Tools Discussed/Used Nipple shield size: 20   Consult Status Consult Status: PRN Follow-up type: Call as needed (NICU)    Tonna Corner 09/14/2016, 1:53 PM

## 2016-09-15 ENCOUNTER — Ambulatory Visit: Payer: Self-pay

## 2016-09-15 NOTE — Lactation Note (Signed)
This note was copied from a baby's chart. Lactation Consultation Note  Patient Name: Heather Hicks [id number] Date: 09/15/2016 Reason for consult: Follow-up assessment;NICU baby Assisted mom with positioning baby in football hold on right side.  Suggested we attempt to latch baby without shield.  Breast compressed and baby latched easily and deep.  Baby nursed actively on right breast for 15 minutes.  Positioned baby on left side in football hold and baby again latched easily without shield.  Praised mom for her efforts.  Questions answered.  Mom continues to pump 30 mls each pumping.  Reviewed importance of lactation outpatient appointment post discharge.  Maternal Data    Feeding Feeding Type: Breast Fed Nipple Type: Dr. Roosvelt Harps Preemie Length of feed: 25 min  LATCH Score/Interventions Latch: Grasps breast easily, tongue down, lips flanged, rhythmical sucking. Intervention(s): Teach feeding cues;Waking techniques Intervention(s): Adjust position;Assist with latch;Breast massage;Breast compression  Audible Swallowing: Spontaneous and intermittent Intervention(s): Alternate breast massage  Type of Nipple: Everted at rest and after stimulation  Comfort (Breast/Nipple): Filling, red/small blisters or bruises, mild/mod discomfort  Problem noted: Mild/Moderate discomfort (right nipple slightly red and tender) Interventions (Mild/moderate discomfort): Post-pump  Hold (Positioning): Assistance needed to correctly position infant at breast and maintain latch. Intervention(s): Breastfeeding basics reviewed;Support Pillows;Position options  LATCH Score: 8  Lactation Tools Discussed/Used     Consult Status Consult Status: PRN Follow-up type: In-patient    Ave Filter 09/15/2016, 1:26 PM

## 2016-09-16 ENCOUNTER — Ambulatory Visit: Payer: Self-pay

## 2016-09-16 NOTE — Lactation Note (Signed)
This note was copied from a baby's chart. Lactation Consultation Note  Patient Name: Heather Hicks [id number] Date: 09/16/2016   NICU baby 103 weeks old. Mom reports that she has been nursing without NS, but it is very painful. Enc mom to use NS as needed, and then call for assistance at next feeding. Discussed with mom that it is better to use shield and gradually move away from its use than to stop nursing d/t pain.  Maternal Data    Feeding Feeding Type: Formula Nipple Type: Dr. Roosvelt Harps Preemie Length of feed: 20 min  LATCH Score/Interventions                      Lactation Tools Discussed/Used     Consult Status      Andres Labrum 09/16/2016, 5:07 PM

## 2016-09-17 ENCOUNTER — Ambulatory Visit: Payer: Self-pay

## 2016-09-17 DIAGNOSIS — L03311 Cellulitis of abdominal wall: Secondary | ICD-10-CM | POA: Diagnosis not present

## 2016-09-17 DIAGNOSIS — Z79891 Long term (current) use of opiate analgesic: Secondary | ICD-10-CM | POA: Diagnosis not present

## 2016-09-17 DIAGNOSIS — N99843 Postprocedural seroma of a genitourinary system organ or structure following other procedure: Secondary | ICD-10-CM | POA: Diagnosis not present

## 2016-09-17 DIAGNOSIS — T8189XA Other complications of procedures, not elsewhere classified, initial encounter: Secondary | ICD-10-CM | POA: Diagnosis not present

## 2016-09-17 NOTE — Lactation Note (Signed)
This note was copied from a baby's chart. Lactation Consultation Note  Patient Name: Heather Hicks [id number] Date: 09/17/2016 Reason for consult: Follow-up assessment;NICU baby  Follow up with mom at infant's bedside. Mom reports BF is going ok. She reports pain with latch and stopped using NS and pain worsened so she starting using NS again. Praised her for her efforts. She reports she does not need assistance at this time. Follow up prn.   Maternal Data    Feeding    LATCH Score/Interventions                      Lactation Tools Discussed/Used     Consult Status Consult Status: PRN Follow-up type: Call as needed    Donn Pierini 09/17/2016, 2:41 PM

## 2016-09-21 ENCOUNTER — Ambulatory Visit: Payer: Self-pay

## 2016-09-21 NOTE — Lactation Note (Signed)
This note was copied from a baby's chart. Lactation Consultation Note  Patient Name: Heather Hicks [id number] Date: 09/21/2016 Reason for consult: Follow-up assessment    With this 40 week old NICU baby, now 17 6/7 weeks CGA, and being discharged to home today. Mom wanted an o/p lactation appointment made, and I set one up for 10/29/16 at 1 pm. Mom will get a referral for this  From Dr. Vanessa Kick, tomorrow, 09/22/16, at her MD appointment.  The baby is term now, but causes severe nipple pain to mom when latching without nipple shied. The baby appears to have some lingual restrictions, possible posterior short frenulum. Mom pumps and has a fair milk supply. Mom knows to call for lactation as needed.   Maternal Data    Feeding Feeding Type: Formula Nipple Type: Dr. Roosvelt Harps Preemie Length of feed: 25 min  LATCH Score/Interventions                      Lactation Tools Discussed/Used     Consult Status Consult Status: Complete Date: 09/28/16 Follow-up type: Out-patient    Tonna Corner 09/21/2016, 11:31 AM

## 2016-09-23 DIAGNOSIS — T8189XA Other complications of procedures, not elsewhere classified, initial encounter: Secondary | ICD-10-CM | POA: Diagnosis not present

## 2016-09-23 DIAGNOSIS — N99843 Postprocedural seroma of a genitourinary system organ or structure following other procedure: Secondary | ICD-10-CM | POA: Diagnosis not present

## 2016-09-23 DIAGNOSIS — Z79891 Long term (current) use of opiate analgesic: Secondary | ICD-10-CM | POA: Diagnosis not present

## 2016-09-23 DIAGNOSIS — L03311 Cellulitis of abdominal wall: Secondary | ICD-10-CM | POA: Diagnosis not present

## 2016-09-28 ENCOUNTER — Ambulatory Visit (HOSPITAL_COMMUNITY)
Admission: RE | Admit: 2016-09-28 | Discharge: 2016-09-28 | Disposition: A | Discharge status: Home / Self Care | Payer: BLUE CROSS/BLUE SHIELD | Source: Ambulatory Visit | Attending: Obstetrics and Gynecology | Admitting: Obstetrics and Gynecology

## 2016-09-28 NOTE — Lactation Note (Signed)
Lactation Consult  Mother's reason for visit:  Feeding assessment. Referral from Dr. Vanessa Kick for Lactation Consultation  239.1 Visit Type:  Outpatient Appointment Notes:  Mom here for feeding assessment, using nipple shield to latch, history of LMS. Baby Heather Hicks born at 32.6 weeks, now 31 weeks old.  Consult:  Initial Lactation Consultant:  Katrine Coho  ________________________________________________________________________  26 Name:  Heather Hicks Date of Birth:  08/16/2016 Pediatrician:  DR. DeClare Gender:  female Gestational Age: [redacted]w[redacted]d (At Birth) Birth Weight:  6 lb 1.9 oz (2775 g) Weight at Discharge:  Weight: 7 lb 14.6 oz (3590 g)             Date of Discharge:  09/21/2016      [facility] Weights   09/18/16 1245 09/19/16 1330 09/20/16 1400  Weight: 7 lb 12.9 oz (3540 g) 7 lb 13.6 oz (3560 g) 7 lb 14.6 oz (3590 g)  Last weight taken from location outside of [facility]:  09/22/16 8 lb. 2.0 oz    Location:Pediatrician's office Weight today:  8 lb. 5.7 oz/3790 gm with clean diaper, baby now 68 weeks old   ________________________________________________________________________  Mother's Name: Heather Hicks Type of delivery:  C/S Breastfeeding Experience:  P2 - BF for 3-4 months, Hx LMS Maternal Medical Conditions:  None reported  Maternal Medications:  PNV, Fenugreek, More Milk Plus by [employer]  ________________________________________________________________________  Breastfeeding History (Post Discharge)  Frequency of breastfeeding:  Every 4 hours Duration of feeding:  15 minutes each breast  Mom is supplementing with Similac 2 oz every 4 hours via bottle Mom has Medela PNS DEBP and pumps occasionally - 1 time per day receiving 30 ml Mom reports baby Heather Hicks is spitty after feedings.   Infant Intake and Output Assessment  Voids:  6-8 in 24 hrs.  Color:  Clear yellow Stools:  1 in 24 hrs.  Color:  Brown and  Yellow  ________________________________________________________________________  Maternal Breast Assessment  Breast:  Soft Nipple:  Erect  Feeding Assessment/Evaluation  Initial feeding assessment:  Infant's oral assessment:  WNL  Positioning:  Football Left breast Assisted Mom with latching using breast compression. Baby able to obtain good depth with initial latch but had difficulty sustaining depth throughout the feeding. Mom's PS was 1 with initial latch but resolved off/on with feeding. Some compression noted on Mom's nipple when baby came off the breast. Did not use nipple shield.  Pre-feed weight:  3790 g  (8 lb. 5.7 oz.) Post-feed weight:  3802 g (8 lb. 6.1 oz.) Amount transferred:  12 ml with nursing for 18 minutes.  On right breast, used 16 nipple shield since this appeared to fit better. Baby had a more nutritive suckling pattern with using nipple shield. Breast milk in nipple shield with the feeding. Also used 20 nipple shield and even though this appeared to be large for Mom, more breast milk was present when baby came off the breast.  Pre-feed weight:  3802 gm (8 lb. 6.1 oz) Post-feed weight: 3826 gm ( 8 lb. 7..0 oz) Amount transferred: 24 ml over 15 minutes Supplemented baby with 60 ml of EBM/formula via bottle  Total amount pumped post feed:  27 ml  Total amount transferred:  36 ml Total supplement given:  60 ml  Baby Heather Hicks passed stool at this visit which was green. Advised Mom he may be getting more fore milk then hind milk at breast since he is sleepy BF. Advised his stools should be the mustard/yellow if getting to  the high fat milk with nursing. Encouraged Mom to pre-pump thru 1st milk ejection to help Baby Heather Hicks get to higher fat milk while at breast. Advised can give the pumped milk back to baby. This will help with his weight gain.  Advised Mom to keep offering breast with each feeding, use 16 nipple shield unless she feels Heather Hicks transferring  better with 20. See how satisfied he is with feeding using 16 nipple shield and if volume she post pumps increases or decreases. Advised Mom she needs to pump more often to maximize milk production. Encouraged to post pump at least 4 times/day for 15 minutes. Continue supplement to support milk production. Continue to supplement Heather Hicks till he transfers milk better at breast. OP f/u with lactation scheduled for Tuesday, 10/12/16 at 1:00 pm. Call for questions/concerns.

## 2016-09-29 DIAGNOSIS — Z6836 Body mass index (BMI) 36.0-36.9, adult: Secondary | ICD-10-CM | POA: Diagnosis not present

## 2016-09-29 DIAGNOSIS — T8189XD Other complications of procedures, not elsewhere classified, subsequent encounter: Secondary | ICD-10-CM | POA: Diagnosis not present

## 2016-10-12 ENCOUNTER — Ambulatory Visit (HOSPITAL_COMMUNITY): Admission: RE | Admit: 2016-10-12 | Payer: BLUE CROSS/BLUE SHIELD | Source: Ambulatory Visit

## 2016-10-14 DIAGNOSIS — Z3202 Encounter for pregnancy test, result negative: Secondary | ICD-10-CM | POA: Diagnosis not present

## 2016-10-14 DIAGNOSIS — R102 Pelvic and perineal pain: Secondary | ICD-10-CM | POA: Diagnosis not present

## 2016-10-14 DIAGNOSIS — D259 Leiomyoma of uterus, unspecified: Secondary | ICD-10-CM | POA: Diagnosis not present

## 2016-11-20 DIAGNOSIS — J050 Acute obstructive laryngitis [croup]: Secondary | ICD-10-CM | POA: Diagnosis not present

## 2016-11-20 DIAGNOSIS — Z79899 Other long term (current) drug therapy: Secondary | ICD-10-CM | POA: Diagnosis not present

## 2016-11-20 DIAGNOSIS — R0603 Acute respiratory distress: Secondary | ICD-10-CM | POA: Diagnosis not present

## 2016-11-30 DIAGNOSIS — J0100 Acute maxillary sinusitis, unspecified: Secondary | ICD-10-CM | POA: Diagnosis not present

## 2017-02-16 ENCOUNTER — Other Ambulatory Visit: Payer: Self-pay | Admitting: Obstetrics and Gynecology

## 2017-02-16 DIAGNOSIS — Z124 Encounter for screening for malignant neoplasm of cervix: Secondary | ICD-10-CM | POA: Diagnosis not present

## 2017-02-16 DIAGNOSIS — Z6838 Body mass index (BMI) 38.0-38.9, adult: Secondary | ICD-10-CM | POA: Diagnosis not present

## 2017-02-16 DIAGNOSIS — Z01419 Encounter for gynecological examination (general) (routine) without abnormal findings: Secondary | ICD-10-CM | POA: Diagnosis not present

## 2017-02-18 LAB — CYTOLOGY - PAP

## 2017-02-24 DIAGNOSIS — D2262 Melanocytic nevi of left upper limb, including shoulder: Secondary | ICD-10-CM | POA: Diagnosis not present

## 2017-02-24 DIAGNOSIS — L821 Other seborrheic keratosis: Secondary | ICD-10-CM | POA: Diagnosis not present

## 2017-03-18 DIAGNOSIS — Z713 Dietary counseling and surveillance: Secondary | ICD-10-CM | POA: Diagnosis not present

## 2017-03-25 DIAGNOSIS — Z713 Dietary counseling and surveillance: Secondary | ICD-10-CM | POA: Diagnosis not present

## 2017-04-01 DIAGNOSIS — Z713 Dietary counseling and surveillance: Secondary | ICD-10-CM | POA: Diagnosis not present

## 2017-04-21 DIAGNOSIS — Z0100 Encounter for examination of eyes and vision without abnormal findings: Secondary | ICD-10-CM | POA: Diagnosis not present

## 2017-05-02 DIAGNOSIS — H1011 Acute atopic conjunctivitis, right eye: Secondary | ICD-10-CM | POA: Diagnosis not present

## 2017-06-02 ENCOUNTER — Ambulatory Visit: Payer: BLUE CROSS/BLUE SHIELD | Admitting: Psychology

## 2017-06-02 ENCOUNTER — Ambulatory Visit (INDEPENDENT_AMBULATORY_CARE_PROVIDER_SITE_OTHER): Payer: BLUE CROSS/BLUE SHIELD | Admitting: Psychology

## 2017-06-02 DIAGNOSIS — F4322 Adjustment disorder with anxiety: Secondary | ICD-10-CM | POA: Diagnosis not present

## 2017-06-30 ENCOUNTER — Ambulatory Visit (INDEPENDENT_AMBULATORY_CARE_PROVIDER_SITE_OTHER): Payer: BLUE CROSS/BLUE SHIELD | Admitting: Psychology

## 2017-06-30 DIAGNOSIS — F4322 Adjustment disorder with anxiety: Secondary | ICD-10-CM | POA: Diagnosis not present

## 2017-07-05 DIAGNOSIS — R1030 Lower abdominal pain, unspecified: Secondary | ICD-10-CM | POA: Diagnosis not present

## 2017-07-05 DIAGNOSIS — N898 Other specified noninflammatory disorders of vagina: Secondary | ICD-10-CM | POA: Diagnosis not present

## 2017-07-05 DIAGNOSIS — Z23 Encounter for immunization: Secondary | ICD-10-CM | POA: Diagnosis not present

## 2017-07-05 DIAGNOSIS — Z1231 Encounter for screening mammogram for malignant neoplasm of breast: Secondary | ICD-10-CM | POA: Diagnosis not present

## 2017-08-04 ENCOUNTER — Ambulatory Visit (INDEPENDENT_AMBULATORY_CARE_PROVIDER_SITE_OTHER): Payer: BLUE CROSS/BLUE SHIELD | Admitting: Psychology

## 2017-08-04 DIAGNOSIS — F4322 Adjustment disorder with anxiety: Secondary | ICD-10-CM

## 2017-09-02 ENCOUNTER — Ambulatory Visit (INDEPENDENT_AMBULATORY_CARE_PROVIDER_SITE_OTHER): Payer: BLUE CROSS/BLUE SHIELD | Admitting: Psychology

## 2017-09-02 DIAGNOSIS — F4322 Adjustment disorder with anxiety: Secondary | ICD-10-CM

## 2017-09-30 ENCOUNTER — Ambulatory Visit: Payer: BLUE CROSS/BLUE SHIELD | Admitting: Psychology

## 2017-09-30 DIAGNOSIS — F4322 Adjustment disorder with anxiety: Secondary | ICD-10-CM

## 2017-10-11 ENCOUNTER — Ambulatory Visit: Payer: BLUE CROSS/BLUE SHIELD | Admitting: Psychology

## 2017-10-11 DIAGNOSIS — F4322 Adjustment disorder with anxiety: Secondary | ICD-10-CM | POA: Diagnosis not present

## 2017-11-10 ENCOUNTER — Ambulatory Visit: Payer: BLUE CROSS/BLUE SHIELD | Admitting: Psychology

## 2017-11-10 DIAGNOSIS — F4322 Adjustment disorder with anxiety: Secondary | ICD-10-CM

## 2017-11-22 ENCOUNTER — Ambulatory Visit: Payer: BLUE CROSS/BLUE SHIELD | Admitting: Psychology

## 2017-11-29 DIAGNOSIS — J0100 Acute maxillary sinusitis, unspecified: Secondary | ICD-10-CM | POA: Diagnosis not present

## 2017-12-01 DIAGNOSIS — D1723 Benign lipomatous neoplasm of skin and subcutaneous tissue of right leg: Secondary | ICD-10-CM | POA: Diagnosis not present

## 2017-12-01 DIAGNOSIS — L309 Dermatitis, unspecified: Secondary | ICD-10-CM | POA: Diagnosis not present

## 2017-12-01 DIAGNOSIS — D225 Melanocytic nevi of trunk: Secondary | ICD-10-CM | POA: Diagnosis not present

## 2017-12-01 DIAGNOSIS — Z85820 Personal history of malignant melanoma of skin: Secondary | ICD-10-CM | POA: Diagnosis not present

## 2017-12-01 DIAGNOSIS — D224 Melanocytic nevi of scalp and neck: Secondary | ICD-10-CM | POA: Diagnosis not present

## 2017-12-21 ENCOUNTER — Ambulatory Visit: Payer: BLUE CROSS/BLUE SHIELD | Admitting: Psychology

## 2018-11-16 DIAGNOSIS — Z13220 Encounter for screening for lipoid disorders: Secondary | ICD-10-CM | POA: Diagnosis not present

## 2018-11-16 DIAGNOSIS — K219 Gastro-esophageal reflux disease without esophagitis: Secondary | ICD-10-CM | POA: Diagnosis not present

## 2018-11-16 DIAGNOSIS — R0789 Other chest pain: Secondary | ICD-10-CM | POA: Diagnosis not present

## 2018-11-19 DIAGNOSIS — H6980 Other specified disorders of Eustachian tube, unspecified ear: Secondary | ICD-10-CM | POA: Diagnosis not present

## 2018-11-19 DIAGNOSIS — J0100 Acute maxillary sinusitis, unspecified: Secondary | ICD-10-CM | POA: Diagnosis not present

## 2019-04-17 DIAGNOSIS — Z20828 Contact with and (suspected) exposure to other viral communicable diseases: Secondary | ICD-10-CM | POA: Diagnosis not present

## 2019-06-15 DIAGNOSIS — H10413 Chronic giant papillary conjunctivitis, bilateral: Secondary | ICD-10-CM | POA: Diagnosis not present

## 2019-06-15 DIAGNOSIS — H5213 Myopia, bilateral: Secondary | ICD-10-CM | POA: Diagnosis not present

## 2019-06-25 ENCOUNTER — Ambulatory Visit (INDEPENDENT_AMBULATORY_CARE_PROVIDER_SITE_OTHER): Payer: BC Managed Care – PPO | Admitting: Psychology

## 2019-06-25 DIAGNOSIS — F411 Generalized anxiety disorder: Secondary | ICD-10-CM | POA: Diagnosis not present

## 2019-07-26 ENCOUNTER — Ambulatory Visit: Payer: BC Managed Care – PPO | Admitting: Psychology

## 2019-08-10 DIAGNOSIS — Z6837 Body mass index (BMI) 37.0-37.9, adult: Secondary | ICD-10-CM | POA: Diagnosis not present

## 2019-08-10 DIAGNOSIS — F419 Anxiety disorder, unspecified: Secondary | ICD-10-CM | POA: Diagnosis not present

## 2019-09-25 DIAGNOSIS — Z6837 Body mass index (BMI) 37.0-37.9, adult: Secondary | ICD-10-CM | POA: Diagnosis not present

## 2019-09-25 DIAGNOSIS — Z1329 Encounter for screening for other suspected endocrine disorder: Secondary | ICD-10-CM | POA: Diagnosis not present

## 2019-09-25 DIAGNOSIS — Z130 Encounter for screening for diseases of the blood and blood-forming organs and certain disorders involving the immune mechanism: Secondary | ICD-10-CM | POA: Diagnosis not present

## 2019-09-25 DIAGNOSIS — Z1231 Encounter for screening mammogram for malignant neoplasm of breast: Secondary | ICD-10-CM | POA: Diagnosis not present

## 2019-09-25 DIAGNOSIS — Z01419 Encounter for gynecological examination (general) (routine) without abnormal findings: Secondary | ICD-10-CM | POA: Diagnosis not present

## 2019-09-25 DIAGNOSIS — Z13220 Encounter for screening for lipoid disorders: Secondary | ICD-10-CM | POA: Diagnosis not present

## 2019-11-12 ENCOUNTER — Ambulatory Visit: Payer: BC Managed Care – PPO | Attending: Internal Medicine

## 2019-11-12 DIAGNOSIS — Z20822 Contact with and (suspected) exposure to covid-19: Secondary | ICD-10-CM | POA: Insufficient documentation

## 2019-11-13 LAB — NOVEL CORONAVIRUS, NAA: SARS-CoV-2, NAA: NOT DETECTED

## 2019-11-14 DIAGNOSIS — D2262 Melanocytic nevi of left upper limb, including shoulder: Secondary | ICD-10-CM | POA: Diagnosis not present

## 2019-11-14 DIAGNOSIS — D225 Melanocytic nevi of trunk: Secondary | ICD-10-CM | POA: Diagnosis not present

## 2019-11-14 DIAGNOSIS — D485 Neoplasm of uncertain behavior of skin: Secondary | ICD-10-CM | POA: Diagnosis not present

## 2019-11-14 DIAGNOSIS — L821 Other seborrheic keratosis: Secondary | ICD-10-CM | POA: Diagnosis not present

## 2019-11-14 DIAGNOSIS — L309 Dermatitis, unspecified: Secondary | ICD-10-CM | POA: Diagnosis not present

## 2019-12-03 DIAGNOSIS — Z0000 Encounter for general adult medical examination without abnormal findings: Secondary | ICD-10-CM | POA: Diagnosis not present

## 2019-12-14 DIAGNOSIS — S91311A Laceration without foreign body, right foot, initial encounter: Secondary | ICD-10-CM | POA: Diagnosis not present

## 2019-12-16 DIAGNOSIS — S91311D Laceration without foreign body, right foot, subsequent encounter: Secondary | ICD-10-CM | POA: Diagnosis not present

## 2020-10-10 ENCOUNTER — Other Ambulatory Visit: Payer: Self-pay

## 2023-01-06 ENCOUNTER — Ambulatory Visit (INDEPENDENT_AMBULATORY_CARE_PROVIDER_SITE_OTHER): Payer: 59

## 2023-01-06 ENCOUNTER — Ambulatory Visit (INDEPENDENT_AMBULATORY_CARE_PROVIDER_SITE_OTHER): Payer: 59 | Admitting: Sports Medicine

## 2023-01-06 VITALS — BP 124/82 | HR 86 | Ht 68.0 in | Wt 253.0 lb

## 2023-01-06 DIAGNOSIS — G8929 Other chronic pain: Secondary | ICD-10-CM | POA: Diagnosis not present

## 2023-01-06 DIAGNOSIS — M545 Low back pain, unspecified: Secondary | ICD-10-CM

## 2023-01-06 MED ORDER — MELOXICAM 15 MG PO TABS: 15.0000 mg | ORAL_TABLET | Freq: Every day | ORAL | 0 refills | Status: AC

## 2023-01-06 MED ORDER — TIZANIDINE HCL 4 MG PO TABS: 4.0000 mg | ORAL_TABLET | Freq: Every day | ORAL | 0 refills | Status: AC

## 2023-01-06 NOTE — Progress Notes (Signed)
Heather Hicks D.[facility] [address] [place] [address] Phone: [phone]   Assessment and Plan:     1. Chronic bilateral low back pain without sciatica -Chronic with exacerbation, initial sports medicine visit - Recurrent flare of low back pain most consistent with lumbar muscular strain based on HPI, physical exam, unremarkable imaging - X-ray obtained in clinic.  My interpretation: No acute fracture or vertebral collapse.  Mild degenerative changes in L4-L5 and L5-S1 facet - Start meloxicam 15 mg daily x2 weeks.  If still having pain after 2 weeks, complete 3rd-week of meloxicam. May use remaining meloxicam as needed once daily for pain control.  Do not to use additional NSAIDs while taking meloxicam.  May use Tylenol [phone] mg 2 to 3 times a day for breakthrough pain. - Start tizanidine 4 to 8 mg nightly as needed for muscle spasms - Start HEP for low back - DG Lumbar Spine 2-3 Views; Future    Pertinent previous records reviewed include none   Follow Up: 4 weeks for reevaluation.  If no improvement or worsening symptoms, could consider OMT versus physical therapy versus prednisone course versus advanced imaging   Subjective:   I, Heather Hicks, am serving as a Neurosurgeon for Doctor Richardean Sale  Chief Complaint: low back pain   HPI:   01/06/23 Patient is a 47 year old female complaining of low back pain. Patient states that she hurt her self a few months ago she was walking the dog and he jerked her , she was bed rest for a week, she was give a muscle relaxer and anti inflammatory for 3 days that helped , HIT training, Monday she was lifting her dog into the bed and flared her low back, intermittent pain, going to [place] on Wednesday, left sided pain more that right , no radiating pain , no numbness or tingling , standing make its feel better, laying flat increases the pain   Relevant Historical Information:  None  pertinent  Additional pertinent review of systems negative.   Current Outpatient Medications:    ibuprofen (ADVIL,MOTRIN) 600 MG tablet, Take 1 tablet (600 mg total) by mouth every 6 (six) hours as needed for moderate pain., Disp: 60 tablet, Rfl: 2   meloxicam (MOBIC) 15 MG tablet, Take 1 tablet (15 mg total) by mouth daily., Disp: 30 tablet, Rfl: 0   omeprazole (PRILOSEC OTC) 20 MG tablet, Take 20 mg by mouth daily., Disp: , Rfl:    oxyCODONE (OXY IR/ROXICODONE) 5 MG immediate release tablet, Take 1 tablet (5 mg total) by mouth every 4 (four) hours as needed for breakthrough pain., Disp: 30 tablet, Rfl: 0   oxyCODONE-acetaminophen (ROXICET) 5-325 MG tablet, Take 1-2 tablets by mouth every 4 (four) hours as needed for severe pain., Disp: 45 tablet, Rfl: 0   tiZANidine (ZANAFLEX) 4 MG tablet, Take 1 tablet (4 mg total) by mouth at bedtime., Disp: 30 tablet, Rfl: 0   Objective:     Vitals:   01/06/23 1413  BP: 124/82  Pulse: 86  SpO2: 96%  Weight: 253 lb (114.8 kg)  Height:  (1.727 m)      Body mass index is 38.47 kg/m.    Physical Exam:    Gen: Appears well, nad, nontoxic and pleasant Psych: Alert and oriented, appropriate mood and affect Neuro: sensation intact, strength is 5/5 in upper and lower extremities, muscle tone wnl Skin: no susupicious lesions or rashes  Back - Normal skin, Spine  with normal alignment and no deformity.   No tenderness to vertebral process palpation.   Left lumbar paraspinous muscles are moderately tender and without spasm NTTP gluteal musculature Straight leg raise negative Trendelenberg negative Piriformis Test negative  Low back pain worsened with lumbar flexion.  No pain in extension  Electronically signed by:  Heather Hicks [facility] 3:02 PM 01/06/23

## 2023-01-06 NOTE — Patient Instructions (Signed)
-   Start meloxicam 15 mg daily x2 weeks.  If still having pain after 2 weeks, complete 3rd-week of meloxicam. May use remaining meloxicam as needed once daily for pain control.  Do not to use additional NSAIDs while taking meloxicam.  May use Tylenol [phone] mg 2 to 3 times a day for breakthrough pain. Low back HEP  Tizanidine 4-8 mg nightly as needed fr muscle spasm  3-4 week follow up

## 2023-01-29 ENCOUNTER — Other Ambulatory Visit: Payer: Self-pay | Admitting: Sports Medicine
